# Patient Record
Sex: Female | Born: 1985
Health system: Southern US, Community
[De-identification: ages and names within clinical notes are randomized; demographics above are authoritative.]

## PROBLEM LIST (undated history)

## (undated) DIAGNOSIS — R87619 Unspecified abnormal cytological findings in specimens from cervix uteri: Secondary | ICD-10-CM

## (undated) DIAGNOSIS — IMO0002 Reserved for concepts with insufficient information to code with codable children: Secondary | ICD-10-CM

## (undated) HISTORY — PX: WISDOM TOOTH EXTRACTION: SHX21

## (undated) HISTORY — DX: Unspecified abnormal cytological findings in specimens from cervix uteri: R87.619

## (undated) HISTORY — DX: Reserved for concepts with insufficient information to code with codable children: IMO0002

---

## 2012-09-14 ENCOUNTER — Ambulatory Visit (HOSPITAL_COMMUNITY): Payer: Self-pay

## 2012-09-15 ENCOUNTER — Encounter: Payer: Self-pay | Admitting: Obstetrics & Gynecology

## 2012-09-15 ENCOUNTER — Ambulatory Visit (INDEPENDENT_AMBULATORY_CARE_PROVIDER_SITE_OTHER): Payer: Self-pay | Admitting: Obstetrics & Gynecology

## 2012-09-15 ENCOUNTER — Other Ambulatory Visit (HOSPITAL_COMMUNITY)
Admission: RE | Admit: 2012-09-15 | Discharge: 2012-09-15 | Disposition: A | Discharge status: Home / Self Care | Payer: Self-pay | Source: Ambulatory Visit | Attending: Obstetrics & Gynecology | Admitting: Obstetrics & Gynecology

## 2012-09-15 VITALS — BP 129/85 | HR 64 | Temp 97.0°F | Resp 20 | Ht 64.0 in | Wt 164.7 lb

## 2012-09-15 DIAGNOSIS — Z01812 Encounter for preprocedural laboratory examination: Secondary | ICD-10-CM

## 2012-09-15 DIAGNOSIS — IMO0002 Reserved for concepts with insufficient information to code with codable children: Secondary | ICD-10-CM

## 2012-09-15 DIAGNOSIS — R6889 Other general symptoms and signs: Secondary | ICD-10-CM

## 2012-09-15 DIAGNOSIS — R87619 Unspecified abnormal cytological findings in specimens from cervix uteri: Secondary | ICD-10-CM | POA: Insufficient documentation

## 2012-09-15 LAB — POCT PREGNANCY, URINE: Preg Test, Ur: NEGATIVE

## 2012-09-15 NOTE — Addendum Note (Signed)
Addended by: Toula Moos on: 09/15/2012 02:31 PM   Modules accepted: Orders

## 2012-09-15 NOTE — Progress Notes (Signed)
  Subjective:    Patient ID: Alyssa Nelson, female    DOB: [birth year], 27 y.o.   MRN: [id number]  HPI  Ms. Alyssa Nelson is here for a colposcopy as she had a LGSIL pap 8/13. See note about smoking per RN.  Review of Systems     Objective:   Physical Exam  Colposcopy adequate with aid of endocervical speculum. Aceto white changes on ectocervix. No evidence of any worse dysplasia ECC obtained.      Assessment & Plan:  LGSIL on pap consistent with colposcopy. If ECC is negative, repeat pap in 1 year. Rec no smoking.

## 2012-09-15 NOTE — Progress Notes (Signed)
Pt denied being a current smoker yet had a pack of cigarettes visible in purse. When questioned, pt stated that she "just hasn't had time to throw them away."  Pt now claims to have stopped smoking "1 or 2 weeks ago... or a month."

## 2013-04-17 ENCOUNTER — Emergency Department (HOSPITAL_BASED_OUTPATIENT_CLINIC_OR_DEPARTMENT_OTHER)
Admission: EM | Admit: 2013-04-17 | Discharge: 2013-04-18 | Disposition: A | Discharge status: Home / Self Care | Payer: Self-pay | Attending: Emergency Medicine | Admitting: Emergency Medicine

## 2013-04-17 ENCOUNTER — Encounter (HOSPITAL_BASED_OUTPATIENT_CLINIC_OR_DEPARTMENT_OTHER): Payer: Self-pay | Admitting: Emergency Medicine

## 2013-04-17 DIAGNOSIS — B309 Viral conjunctivitis, unspecified: Secondary | ICD-10-CM | POA: Insufficient documentation

## 2013-04-17 DIAGNOSIS — H5710 Ocular pain, unspecified eye: Secondary | ICD-10-CM | POA: Insufficient documentation

## 2013-04-17 DIAGNOSIS — H53149 Visual discomfort, unspecified: Secondary | ICD-10-CM | POA: Insufficient documentation

## 2013-04-17 DIAGNOSIS — H16143 Punctate keratitis, bilateral: Secondary | ICD-10-CM

## 2013-04-17 DIAGNOSIS — H5789 Other specified disorders of eye and adnexa: Secondary | ICD-10-CM | POA: Insufficient documentation

## 2013-04-17 DIAGNOSIS — H16149 Punctate keratitis, unspecified eye: Secondary | ICD-10-CM | POA: Insufficient documentation

## 2013-04-17 DIAGNOSIS — Z87891 Personal history of nicotine dependence: Secondary | ICD-10-CM | POA: Insufficient documentation

## 2013-04-17 DIAGNOSIS — H579 Unspecified disorder of eye and adnexa: Secondary | ICD-10-CM | POA: Insufficient documentation

## 2013-04-17 DIAGNOSIS — H538 Other visual disturbances: Secondary | ICD-10-CM | POA: Insufficient documentation

## 2013-04-17 NOTE — ED Provider Notes (Signed)
CSN: [id number]     Arrival date & time 04/17/13  2305 History    This chart was scribed for Jones Skene, MD by Quintella Reichert, ED scribe.  This patient was seen in room MH10/MH10 and the patient's care was started at 11:59 PM.     Chief Complaint  Patient presents with  . Eye Problem    The history is provided by the patient. No language interpreter was used.    HPI Comments: Alyssa Nelson is a 27 y.o. female who presents to the Emergency Department complaining of 2 weeks of bilateral burning eye pain with associated redness, itching, drainage, photophobia and blurred vision.   Pt jsd been seen in the ED multiple times since symptoms began and diagnosed with conjunctivitis and has been placed on antibiotic eye drops, without relief.  She was also seen by an ophthalmologist recently and placed on lubricating eye drops, which have also not been effective.  She has attempted to treat pain with ibuprofen, which provides enough relief to allow her to sleep.  Pt denies prior h/o similar symptoms.  She denies environmental allergies to her knowledge.  She denies having bright lights flashed in her eyes and states she does not feel as if there are foreign bodies in her eyes.  She denies fevers, chill, abdominal pain, vaginal discharge, nausea, vomiting, rhinorrhea, cough, congestion, sore throat, CP, SOB, myalgias, arthralgias, or dysuria.    Past Medical History  Diagnosis Date  . Abnormal Pap smear     Past Surgical History  Procedure Laterality Date  . Wisdom tooth extraction      age 21    No family history on file.   History  Substance Use Topics  . Smoking status: Former Games developer  . Smokeless tobacco: Not on file  . Alcohol Use: Yes     Comment: socially    OB History   Grav Para Term Preterm Abortions TAB SAB Ect Mult Living   0                Review of Systems At least 10pt or greater review of systems completed and are negative except where specified in the  HPI.  Allergies  Review of patient's allergies indicates no known allergies.  Home Medications  No current outpatient prescriptions on file.  BP 128/79  Pulse 82  Temp(Src) 98.8 F (37.1 C) (Oral)  Resp 16  Ht 5\' 4"  (1.626 m)  Wt 160 lb (72.576 kg)  BMI 27.45 kg/m2  SpO2 97%  LMP 03/22/2013  Physical Exam  Nursing notes reviewed.  Electronic medical record reviewed. VITAL SIGNS:   Filed Vitals:   04/17/13 2327  BP: 128/79  Pulse: 82  Temp: 98.8 F (37.1 C)  TempSrc: Oral  Resp: 16  Height: 5\' 4"  (1.626 m)  Weight: 160 lb (72.576 kg)  SpO2: 97%   CONSTITUTIONAL: Awake, oriented, appears non-toxic HENT: Atraumatic, normocephalic, oral mucosa pink and moist, airway patent. Nares patent without drainage. External ears normal. EYES: Conjunctiva clear, EOMI, PERRLA, punctate fluorescein uptake bilaterally. No hyphema, no hypopyon. No uveitis, no photophobia, anterior chambers clear. NECK: Trachea midline, non-tender, supple CARDIOVASCULAR: Normal heart rate, Normal rhythm, No murmurs, rubs, gallops PULMONARY/CHEST: Clear to auscultation, no rhonchi, wheezes, or rales. Symmetrical breath sounds. Non-tender. ABDOMINAL: Non-distended, soft, non-tender - no rebound or guarding.  BS normal. NEUROLOGIC: Non-focal, moving all four extremities, no gross sensory or motor deficits. EXTREMITIES: No clubbing, cyanosis, or edema SKIN: Warm, Dry, No erythema, No rash  ED Course  Procedures (including critical care time)  DIAGNOSTIC STUDIES: Oxygen Saturation is 97% on room air, normal by my interpretation.    COORDINATION OF CARE: 11:59 PM-Discussed treatment plan which includes add Claritin to her medication regimen and to increase usage of lubricating eyedrops with pt at bedside and pt agreed to plan.   Labs Reviewed - No data to display No results found. 1. Viral conjunctivitis   2. Punctate keratitis of both eyes    Medications  proparacaine (ALCAINE) 0.5 % ophthalmic  solution 2 drop (2 drops Both Eyes Given by Other 04/18/13 0020)  fluorescein ophthalmic strip 2 strip (2 strips Both Eyes Given by Other 04/18/13 0021)  loratadine (CLARITIN) tablet 10 mg (10 mg Oral Given 04/18/13 0015)    MDM  Patient likely viral conjunctivitis however there may be an allergic component to this patient's problems. She is are seen ophthalmologist recommended moisturizing eye drops, on my exam, punctate keratitis suggestive of chronic dry eye, again this could potentially be exacerbated by viral or allergic symptoms. Patient is appropriate followup, can see the ophthalmologist again, we'll increase frequency of lubricating eyedrops and Claritin.  I explained the diagnosis and have given explicit precautions to return to the ER including changes in vision, double vision or any other new or worsening symptoms. The patient understands and accepts the medical plan as it's been dictated and I have answered their questions. Discharge instructions concerning home care and prescriptions have been given.  The patient is STABLE and is discharged to home in good condition.     I personally performed the services described in this documentation, which was scribed in my presence. The recorded information has been reviewed and is accurate. Jones Skene, M.D.      Jones Skene, MD 04/18/13 [phone]

## 2013-04-17 NOTE — ED Notes (Signed)
Eyes Red, painful, swollen, draining and itching x2 weeks.  Sts she was seen by [facility] ED x2 and given drops both times.  Sts she saw an "eye specialist" and was given a lubricant eye drop. Eyes are no better.

## 2013-04-18 MED ORDER — FLUORESCEIN SODIUM 1 MG OP STRP
2.0000 | ORAL_STRIP | Freq: Once | OPHTHALMIC | Status: AC
  Administered 2013-04-18: 2 via OPHTHALMIC
  Filled 2013-04-18: qty 1

## 2013-04-18 MED ORDER — PROPARACAINE HCL 0.5 % OP SOLN
2.0000 [drp] | Freq: Once | OPHTHALMIC | Status: AC
  Administered 2013-04-18: 2 [drp] via OPHTHALMIC
  Filled 2013-04-18: qty 15

## 2013-04-18 MED ORDER — LORATADINE 10 MG PO TABS
10.0000 mg | ORAL_TABLET | Freq: Once | ORAL | Status: AC
  Administered 2013-04-18: 10 mg via ORAL
  Filled 2013-04-18: qty 1

## 2013-04-18 MED ORDER — NAPHAZOLINE-PHENIRAMINE 0.025-0.3 % OP SOLN
1.0000 [drp] | Freq: Once | OPHTHALMIC | Status: DC
  Filled 2013-04-18: qty 15

## 2013-04-18 MED ORDER — LORATADINE 10 MG PO TABS: 10.0000 mg | ORAL_TABLET | Freq: Every day | ORAL | Status: DC

## 2013-04-18 NOTE — ED Notes (Signed)
MD at bedside for eye exam

## 2013-04-18 NOTE — ED Notes (Signed)
MD back at bedside to complete eye exam 

## 2013-10-01 ENCOUNTER — Emergency Department (HOSPITAL_BASED_OUTPATIENT_CLINIC_OR_DEPARTMENT_OTHER)
Admission: EM | Admit: 2013-10-01 | Discharge: 2013-10-01 | Disposition: A | Discharge status: Home / Self Care | Payer: Self-pay | Attending: Emergency Medicine | Admitting: Emergency Medicine

## 2013-10-01 ENCOUNTER — Emergency Department (HOSPITAL_BASED_OUTPATIENT_CLINIC_OR_DEPARTMENT_OTHER): Payer: Self-pay

## 2013-10-01 ENCOUNTER — Encounter (HOSPITAL_BASED_OUTPATIENT_CLINIC_OR_DEPARTMENT_OTHER): Payer: Self-pay | Admitting: Emergency Medicine

## 2013-10-01 DIAGNOSIS — Z79899 Other long term (current) drug therapy: Secondary | ICD-10-CM | POA: Insufficient documentation

## 2013-10-01 DIAGNOSIS — Z3202 Encounter for pregnancy test, result negative: Secondary | ICD-10-CM | POA: Insufficient documentation

## 2013-10-01 DIAGNOSIS — R51 Headache: Secondary | ICD-10-CM | POA: Insufficient documentation

## 2013-10-01 DIAGNOSIS — Z87891 Personal history of nicotine dependence: Secondary | ICD-10-CM | POA: Insufficient documentation

## 2013-10-01 DIAGNOSIS — Z791 Long term (current) use of non-steroidal anti-inflammatories (NSAID): Secondary | ICD-10-CM | POA: Insufficient documentation

## 2013-10-01 DIAGNOSIS — R1013 Epigastric pain: Secondary | ICD-10-CM | POA: Insufficient documentation

## 2013-10-01 DIAGNOSIS — M549 Dorsalgia, unspecified: Secondary | ICD-10-CM | POA: Insufficient documentation

## 2013-10-01 DIAGNOSIS — R079 Chest pain, unspecified: Secondary | ICD-10-CM | POA: Insufficient documentation

## 2013-10-01 DIAGNOSIS — R109 Unspecified abdominal pain: Secondary | ICD-10-CM

## 2013-10-01 DIAGNOSIS — R6883 Chills (without fever): Secondary | ICD-10-CM | POA: Insufficient documentation

## 2013-10-01 LAB — URINALYSIS, ROUTINE W REFLEX MICROSCOPIC
Bilirubin Urine: NEGATIVE
Glucose, UA: NEGATIVE mg/dL
Hgb urine dipstick: NEGATIVE
KETONES UR: NEGATIVE mg/dL
Nitrite: NEGATIVE
Protein, ur: NEGATIVE mg/dL
SPECIFIC GRAVITY, URINE: 1.016 (ref 1.005–1.030)
Urobilinogen, UA: 0.2 mg/dL (ref 0.0–1.0)
pH: 6.5 (ref 5.0–8.0)

## 2013-10-01 LAB — CBC WITH DIFFERENTIAL/PLATELET
BASOS ABS: 0 10*3/uL (ref 0.0–0.1)
Basophils Relative: 0 % (ref 0–1)
Eosinophils Absolute: 0.2 10*3/uL (ref 0.0–0.7)
Eosinophils Relative: 2 % (ref 0–5)
HCT: 41.7 % (ref 36.0–46.0)
Hemoglobin: 13.9 g/dL (ref 12.0–15.0)
LYMPHS PCT: 38 % (ref 12–46)
Lymphs Abs: 2.7 10*3/uL (ref 0.7–4.0)
MCH: 30.9 pg (ref 26.0–34.0)
MCHC: 33.3 g/dL (ref 30.0–36.0)
MCV: 92.7 fL (ref 78.0–100.0)
Monocytes Absolute: 0.6 10*3/uL (ref 0.1–1.0)
Monocytes Relative: 9 % (ref 3–12)
NEUTROS ABS: 3.6 10*3/uL (ref 1.7–7.7)
Neutrophils Relative %: 51 % (ref 43–77)
PLATELETS: 298 10*3/uL (ref 150–400)
RBC: 4.5 MIL/uL (ref 3.87–5.11)
RDW: 13.1 % (ref 11.5–15.5)
WBC: 7.1 10*3/uL (ref 4.0–10.5)

## 2013-10-01 LAB — COMPREHENSIVE METABOLIC PANEL
ALBUMIN: 3.8 g/dL (ref 3.5–5.2)
ALT: 12 U/L (ref 0–35)
AST: 19 U/L (ref 0–37)
Alkaline Phosphatase: 75 U/L (ref 39–117)
BUN: 9 mg/dL (ref 6–23)
CHLORIDE: 108 meq/L (ref 96–112)
CO2: 21 meq/L (ref 19–32)
CREATININE: 0.8 mg/dL (ref 0.50–1.10)
Calcium: 9.1 mg/dL (ref 8.4–10.5)
GFR calc Af Amer: >90 mL/min (ref >90)
Glucose, Bld: 90 mg/dL (ref 70–99)
Potassium: 4.5 mEq/L (ref 3.7–5.3)
Sodium: 144 mEq/L (ref 137–147)
Total Protein: 7.3 g/dL (ref 6.0–8.3)

## 2013-10-01 LAB — LIPASE, BLOOD: Lipase: 32 U/L (ref 11–59)

## 2013-10-01 LAB — PREGNANCY, URINE: Preg Test, Ur: NEGATIVE

## 2013-10-01 LAB — URINE MICROSCOPIC-ADD ON

## 2013-10-01 MED ORDER — FAMOTIDINE 20 MG PO TABS: 20.0000 mg | ORAL_TABLET | Freq: Two times a day (BID) | ORAL | Status: DC

## 2013-10-01 MED ORDER — NAPROXEN 500 MG PO TABS: 500.0000 mg | ORAL_TABLET | Freq: Two times a day (BID) | ORAL | Status: DC

## 2013-10-01 NOTE — Discharge Instructions (Signed)
°Emergency Department Resource Guide °1) Find a Doctor and Pay Out of Pocket °Although you won't have to find out who is covered by your insurance plan, it is a good idea to ask around and get recommendations. You will then need to call the office and see if the doctor you have chosen will accept you as a new patient and what types of options they offer for patients who are self-pay. Some doctors offer discounts or will set up payment plans for their patients who do not have insurance, but you will need to ask so you aren't surprised when you get to your appointment. ° °2) Contact Your Local Health Department °Not all health departments have doctors that can see patients for sick visits, but many do, so it is worth a call to see if yours does. If you don't know where your local health department is, you can check in your phone book. The CDC also has a tool to help you locate your state's health department, and many state websites also have listings of all of their local health departments. ° °3) Find a Walk-in Clinic °If your illness is not likely to be very severe or complicated, you may want to try a walk in clinic. These are popping up all over the country in pharmacies, drugstores, and shopping centers. They're usually staffed by nurse practitioners or physician assistants that have been trained to treat common illnesses and complaints. They're usually fairly quick and inexpensive. However, if you have serious medical issues or chronic medical problems, these are probably not your best option. ° °No Primary Care Doctor: °- Call Health Connect at  832-8000 - they can help you locate a primary care doctor that  accepts your insurance, provides certain services, etc. °- Physician Referral Service- 1-800-533-3463 ° °Chronic Pain Problems: °Organization         Address  Phone   Notes  °[facility]  (336) 297-2271 Patients need to be referred by their primary care doctor.  ° °Medication  Assistance: °Organization         Address  Phone   Notes  °[place] [facility] [address] °[place], [place] [address] (336) 641-8030 --Must be a resident of [place] °-- Must have NO insurance coverage whatsoever (no Medicaid/ Medicare, etc.) °-- The pt. MUST have a primary care doctor that directs their care regularly and follows them in the community °  °[facility]  (866) 331-1348   °[employer]  (888) 892-1162   ° °Agencies that provide inexpensive medical care: °Organization         Address  Phone   Notes  °[facility]  (336) 832-8035   °[facility]    (336) 832-7272   °[facility] [address] °[place], [place] [address] (336) 832-4777   °[facility] [address], °[place] (336) 271-4999   °[facility]    (336) 373-0678   °[facility]    (336) 272-1050   °[facility] ° [address], [place] Phone:  (336) 832-4444, Fax:  (336) 832-4440 Hours of Operation:  9 am - 6 pm, M-F.  Also accepts Medicaid/Medicare and self-pay.  °[facility] ° [address], [place] Phone: (336) 832-3150, Fax: (336) [phone]. Hours of Operation:  8:30 am - 5:30 pm, M-F.  Also accepts Medicaid and self-pay.  °[facility] [place] [address], [place] Phone: (336) 878-6027   °[facility] [address], [place], [place] (336)723-1848, Ext. 123 Mondays & Thursdays: 7-9 AM.  First 15 patients are seen on a first come, first serve basis. °  ° °Medicaid-accepting [place] Providers: ° °Organization         Address  Phone   Notes  °[facility] [address], [place] (336) 641-2100 Also accepts self-pay patients.  °[facility] [address], [place] ° (336) 856-9996   °[facility] [address], [place]  (336) 288-8857   °[facility] [address] [place] [address], [place] (336) 299-7000   °Veita Bland [address], [place]  ° (336) 373-1557 Only accepts [place] Access Medicaid patients after they have their name applied to their card.  ° °Self-Pay (no insurance) in [place]: ° °Organization         Address  Phone   Notes  °Sickle Cell Patients, [facility] [address], [place] (336) 832-1970   °[facility] [address], [place] (336) 832-4400   °[facility] ° [address], [place] (336) 992-4800   °[facility]/Dr. Osei-Bonsu ° [address] [place] [address], [place] or [address], [place] (336) [phone] Phone number for both [place] and [place] locations is the same.  °[facility] [address], [place] (336) 299-0000   °[facility] [address] [place] [address], [place] or [address] (336) 852-7530 °(336) 878-2260   °[facility] [address], [place] (336) 350-1642, phone; (336) 294-5005, fax Sees patients 1st and 3rd Saturday of every month.  Must not qualify for public or private insurance (i.e. Medicaid, Medicare, [place] Health Choice, Veterans' Benefits) • Household income should be no more than 200% of the poverty level •The clinic cannot treat you if you are pregnant or think you are pregnant • Sexually transmitted diseases are not treated at the clinic.  ° ° °Dental Care: °Organization         Address  Phone  Notes  °[place] [facility] [address], [place] (336) 641-6152 Accepts children up to age 21 who are enrolled in Medicaid or [place] Health Choice; pregnant women with a Medicaid card; and children who have applied for Medicaid or [place] Health Choice, but were declined, whose parents can pay a reduced fee at time of service.  °[place]  [facility] [place]  [address], [place] (336) 641-7733 Accepts children up to age 21 who are enrolled in Medicaid or [place] Health Choice; pregnant women with a Medicaid card; and children who have applied for Medicaid or [place] Health Choice, but were declined, whose parents can pay a reduced fee at time of service.  °[facility] ° [address], [place] (336) 641-4533 Patients are seen by appointment only. Walk-ins are not accepted. [facility] will see patients 18 years of age and older. °Monday - Tuesday (8am-5pm) °Most Wednesdays (8:30-5pm) °$30 per visit, cash only  °[facility] ° [address], [place] (336) 641-4533 Patients are seen by appointment only. Walk-ins are not accepted. [facility] will see patients 18 years of age and older. °One   Wednesday Evening (Monthly: Volunteer Based).  $30 per visit, cash only  °[facility]  (919) 537-3737 for adults; Children under age 4, call Graduate Pediatric Dentistry at (919) [phone]. Children aged 4-14, please call (919) [phone] to request a pediatric application. ° Dental services are provided in all areas of dental care including fillings, crowns and bridges, complete and partial dentures, implants, gum treatment, root canals, and extractions. Preventive care is also provided. Treatment is provided to both adults and children. °Patients are selected via a lottery and there is often a waiting list. °  °[facility] [address], °[place] ° (336) 763-8833 [email or web address] °  °[facility] [address], [place], [place] (336)723-1848, Ext. 123 Second and Fourth Thursday of each month, opens at 6:30 AM; Clinic ends at 9 AM.  Patients are seen on a first-come first-served basis, and a limited number are seen during each clinic.  ° °[facility] ° [address], [place], [place] (336) 723-7904    Eligibility Requirements °You must have lived in [place], [place], or [place] counties for at least the last three months. °  You cannot be eligible for state or federal sponsored healthcare insurance, including Veterans Administration, Medicaid, or Medicare. °  You generally cannot be eligible for healthcare insurance through your employer.  °  How to apply: °Eligibility screenings are held every Tuesday and Wednesday afternoon from 1:00 pm until 4:00 pm. You do not need an appointment for the interview!  °[facility] [address], [place], [place] 336-631-2330   °[facility]  336-342-8273   °[facility]  336-703-3100   °[facility]  336-570-6415   ° °Behavioral Health Resources in the Community: °Intensive Outpatient Programs °Organization         Address  Phone  Notes  °[facility] [address], [place], [place] 336-878-6098   °[facility] [address], [place], [place] 336-832-9800   °[facility] [address], [place], [place] ° 336-882-2125   °[facility] [address],  °[place], [place] 1-800-853-5163 or 336-641-4981   °Substance Abuse Resources °Organization         Address  Phone  Notes  °[facility]  336-882-2125   °[facility]  336-784-9470   °[facility]  336-285-9073   °[facility]  336-845-3988   °Residential & Outpatient Substance Abuse Program  1-800-659-3381   °Psychological Services °Organization         Address  Phone  Notes  °[facility]  336- 832-9600   °[facility]  336- 378-7881   °[facility] [address], [place] 1-800-853-5163 or 336-641-4981   ° °Mobile Crisis Teams °Organization         Address  Phone  Notes  °[facility], Mobile Crisis Care Unit  1-877-626-1772   °Assertive °[facility] ° [address]  [place], [place] 336-834-9664   °Sharon DeEsch [address] °[place] 336-554-5454   ° °Self-Help/Support Groups °Organization         Address  Phone             Notes  °[facility] of  - variety of support groups  336- 373-1402 Call for more information  °Narcotics Anonymous (NA), [facility] [address], °[place]  2 meetings at this location  ° °  Residential Treatment Programs Organization         Address  Phone  Notes  [facility] [address],    [place]  [phone]   [facility]  [address], [place] [phone], [place], [place] [phone]   [facility] [address], [place] [phone] Admissions: 8am-3pm M-F  [facility] [address]    [place], [place] [phone]   [facility] [address], [place], [place] [phone]   [facility] [address]  [place], [place] [phone]   [facility] Intensive Outpatient [address], [place], [place] [phone]) [phone]   [facility] [address]  [place], [place] [phone] or [phone]   [facility] [address] [place], [place] [phone] Accepts Medicaid  [facility] [address]  [place] [phone] Substance Abuse/Addiction Treatment   [facility] Organization         Address  Phone  Notes  [facility]  774-711-7637(888) [phone]   Angie FavaJulie Brannon, PhD [address] [place], [place]   [phone](336) [phone] or [phone](336) [phone]   [facility]   [address] [place], [place] [phone](336) ([phone])[phone]   [facility] [address], [place], [place] [phone](336) [phone] Insurance/Medicaid/sponsorship through [facility] [address]                                    [place], [place] [phone](336) [phone] Therapy/tele-psych/case    [facility] [address]   [place], [place] [phone](336) [phone]    Dr. Lolly MustacheArfeen  ([phone](336) ([phone]   [facility] [place] [facility]  [employer] [facility] 1) [address], [place] 2) [address], [place] 3)  [address], [place] [phone](336) [phone](336) [phone]  [phone](336) [phone]   [facility] Child Abuse Hotline (832) 473-8120(336) (801)316-9892 or [phone](336) 820-080-3066 (After Hours)      Resource guide provided to help you find a primary care Dr. for further evaluation of the long-standing chest pain. Today's workup was negative. No evidence of any acute cardiac problem or lung problem. In addition, labs were all normal as well. Urinalysis was normal. Return for any new or worse symptoms. Take medications provided as directed.

## 2013-10-01 NOTE — ED Notes (Signed)
Reports reoccurrence of vomiting, blood in vomit, and chest pain this morning.  Has been dealing with this since 2013.

## 2013-10-01 NOTE — ED Provider Notes (Signed)
CSN: [id number]     Arrival date & time 10/01/13  1439 History  This chart was scribed for Alyssa Jakes, MD by Ronal Fear, ED Scribe. This patient was seen in room MH05/MH05 and the patient's care was started at 3:44 PM.    Chief Complaint  Patient presents with  . Emesis   (Consider location/radiation/quality/duration/timing/severity/associated sxs/prior Treatment) Patient is a 28 y.o. female presenting with vomiting. The history is provided by the patient. No language interpreter was used.  Emesis Associated symptoms: abdominal pain, chills and headaches   Associated symptoms: no diarrhea and no sore throat    HPI Comments: Alyssa Nelson is a 28 y.o. female who presents to the Emergency Department complaining of 2 episodes of vomiting and chest pain this morning with some blood in the vomit. She also complains of sharp epigastric pain that radiates to her back since she began her depo 10 years ago.  Pt states that this is a recurrent problem that she has been dealing with since 2013. Pt states that today she just got tired of the symptoms and wants to find out why she is having the generalized stabbing sharp chest pain.  The pain is intermittent and occurs for less than 5 minutes, it subsides for 2 minutes and then reoccurs.  Pt has taking oxycodone and Vicodin for the CP with relief.    Past Medical History  Diagnosis Date  . Abnormal Pap smear    Past Surgical History  Procedure Laterality Date  . Wisdom tooth extraction      age 80   No family history on file. History  Substance Use Topics  . Smoking status: Former Games developer  . Smokeless tobacco: Not on file  . Alcohol Use: 3.6 oz/week    6 Cans of beer per week     Comment: socially   OB History   Grav Para Term Preterm Abortions TAB SAB Ect Mult Living   0              Review of Systems  Constitutional: Positive for chills. Negative for fever.  HENT: Negative for rhinorrhea and sore throat.   Eyes: Negative for  visual disturbance.  Respiratory: Negative for cough and shortness of breath.   Cardiovascular: Positive for chest pain. Negative for leg swelling.  Gastrointestinal: Positive for vomiting and abdominal pain. Negative for diarrhea.  Genitourinary: Negative for dysuria.  Musculoskeletal: Positive for back pain.  Skin: Negative for rash.  Neurological: Positive for headaches.  Hematological: Does not bruise/bleed easily.  Psychiatric/Behavioral: Negative for confusion.  All other systems reviewed and are negative.    Allergies  Review of patient's allergies indicates no known allergies.  Home Medications   Current Outpatient Rx  Name  Route  Sig  Dispense  Refill  . medroxyPROGESTERone (DEPO-PROVERA) 150 MG/ML injection   Intramuscular   Inject 150 mg into the muscle every 3 (three) months.         . famotidine (PEPCID) 20 MG tablet   Oral   Take 1 tablet (20 mg total) by mouth 2 (two) times daily.   30 tablet   0   . loratadine (CLARITIN) 10 MG tablet   Oral   Take 1 tablet (10 mg total) by mouth daily.   30 tablet   0   . naproxen (NAPROSYN) 500 MG tablet   Oral   Take 1 tablet (500 mg total) by mouth 2 (two) times daily.   14 tablet   0  BP 118/67  Temp(Src) 98.7 F (37.1 C) (Oral)  Resp 16  Ht 5\' 4"  (1.626 m)  Wt 160 lb (72.576 kg)  BMI 27.45 kg/m2  SpO2 100% Physical Exam  Nursing note and vitals reviewed. Constitutional: She is oriented to person, place, and time. She appears well-developed and well-nourished. No distress.  HENT:  Head: Normocephalic and atraumatic.  Mouth/Throat: Oropharynx is clear and moist. No oropharyngeal exudate.  Eyes: EOM are normal.  Neck: Neck supple. No tracheal deviation present.  Cardiovascular: Normal rate and regular rhythm.   Pulmonary/Chest: Effort normal and breath sounds normal. No respiratory distress. She has no wheezes. She has no rales.  Abdominal: Soft. Bowel sounds are normal. There is no tenderness.   Musculoskeletal: Normal range of motion. She exhibits no edema.  Neurological: She is alert and oriented to person, place, and time. No cranial nerve deficit. She exhibits normal muscle tone. Coordination normal.  Skin: Skin is warm and dry.  Psychiatric: She has a normal mood and affect. Her behavior is normal.    ED Course  Procedures (including critical care time)  DIAGNOSTIC STUDIES: Oxygen Saturation is 100% on RA, normal by my interpretation.    COORDINATION OF CARE: 3:59 PM- Pt advised of plan for treatment and pt agrees.    Labs Review Labs Reviewed  URINALYSIS, ROUTINE W REFLEX MICROSCOPIC - Abnormal; Notable for the following:    Leukocytes, UA SMALL (*)    All other components within normal limits  COMPREHENSIVE METABOLIC PANEL - Abnormal; Notable for the following:    Total Bilirubin <0.2 (*)    All other components within normal limits  PREGNANCY, URINE  URINE MICROSCOPIC-ADD ON  LIPASE, BLOOD  CBC WITH DIFFERENTIAL   Results for orders placed during the hospital encounter of 10/01/13  URINALYSIS, ROUTINE W REFLEX MICROSCOPIC      Result Value Range   Color, Urine YELLOW  YELLOW   APPearance CLEAR  CLEAR   Specific Gravity, Urine 1.016  1.005 - 1.030   pH 6.5  5.0 - 8.0   Glucose, UA NEGATIVE  NEGATIVE mg/dL   Hgb urine dipstick NEGATIVE  NEGATIVE   Bilirubin Urine NEGATIVE  NEGATIVE   Ketones, ur NEGATIVE  NEGATIVE mg/dL   Protein, ur NEGATIVE  NEGATIVE mg/dL   Urobilinogen, UA 0.2  0.0 - 1.0 mg/dL   Nitrite NEGATIVE  NEGATIVE   Leukocytes, UA SMALL (*) NEGATIVE  PREGNANCY, URINE      Result Value Range   Preg Test, Ur NEGATIVE  NEGATIVE  URINE MICROSCOPIC-ADD ON      Result Value Range   Squamous Epithelial / LPF RARE  RARE   WBC, UA 3-6  <3 WBC/hpf   Bacteria, UA RARE  RARE  COMPREHENSIVE METABOLIC PANEL      Result Value Range   Sodium 144  137 - 147 mEq/L   Potassium 4.5  3.7 - 5.3 mEq/L   Chloride 108  96 - 112 mEq/L   CO2 21  19 - 32  mEq/L   Glucose, Bld 90  70 - 99 mg/dL   BUN 9  6 - 23 mg/dL   Creatinine, Ser 9.56  0.50 - 1.10 mg/dL   Calcium 9.1  8.4 - 21.3 mg/dL   Total Protein 7.3  6.0 - 8.3 g/dL   Albumin 3.8  3.5 - 5.2 g/dL   AST 19  0 - 37 U/L   ALT 12  0 - 35 U/L   Alkaline Phosphatase 75  39 - 117  U/L   Total Bilirubin <0.2 (*) 0.3 - 1.2 mg/dL   GFR calc non Af Amer >90  >90 mL/min   GFR calc Af Amer >90  >90 mL/min  LIPASE, BLOOD      Result Value Range   Lipase 32  11 - 59 U/L  CBC WITH DIFFERENTIAL      Result Value Range   WBC 7.1  4.0 - 10.5 K/uL   RBC 4.50  3.87 - 5.11 MIL/uL   Hemoglobin 13.9  12.0 - 15.0 g/dL   HCT 16.141.7  09.636.0 - 04.546.0 %   MCV 92.7  78.0 - 100.0 fL   MCH 30.9  26.0 - 34.0 pg   MCHC 33.3  30.0 - 36.0 g/dL   RDW 40.913.1  81.111.5 - 91.415.5 %   Platelets 298  150 - 400 K/uL   Neutrophils Relative % 51  43 - 77 %   Neutro Abs 3.6  1.7 - 7.7 K/uL   Lymphocytes Relative 38  12 - 46 %   Lymphs Abs 2.7  0.7 - 4.0 K/uL   Monocytes Relative 9  3 - 12 %   Monocytes Absolute 0.6  0.1 - 1.0 K/uL   Eosinophils Relative 2  0 - 5 %   Eosinophils Absolute 0.2  0.0 - 0.7 K/uL   Basophils Relative 0  0 - 1 %   Basophils Absolute 0.0  0.0 - 0.1 K/uL    Imaging Review Dg Chest 2 View  10/01/2013   CLINICAL DATA:  Vomiting  EXAM: CHEST  2 VIEW  COMPARISON:  DG THORACIC SPINE 4+V dated 09/22/2010  FINDINGS: The heart size and mediastinal contours are within normal limits. Both lungs are clear. The visualized skeletal structures are unremarkable. Minimal biapical pleural thickening is incidentally noted.  IMPRESSION: No active cardiopulmonary disease.   Electronically Signed   By: Christiana PellantGretchen  Green M.D.   On: 10/01/2013 17:15    EKG Interpretation    Date/Time:  Saturday October 01 2013 16:46:44 EST Ventricular Rate:  64 PR Interval:  128 QRS Duration: 80 QT Interval:  396 QTC Calculation: 408 R Axis:   51 Text Interpretation:  Normal sinus rhythm Normal ECG No previous ECGs available Confirmed by  Georgina Krist  MD, Canary Fister ([id number]) on 10/01/2013 4:54:04 PM            MDM   1. Chest pain   2. Abdominal pain    Workup for chest pain without any significant findings. EKG without any acute changes. Troponin was negative. Chest x-ray negative for pneumonia pneumothorax or pulmonary edema. Labs without leukocytosis or anemia urinalysis negative for urinary tract infection no pregnancy. Patient's electrolytes including liver function test are negative. The liver function tests as well as lipase which does not show pancreatitis was done for the complaining of the epigastric abdominal pain. Patient states that she has been seen several times for this in the past without any specific findings. Have given her a resource guide for additional followup. We'll treat her with Pepcid and Naprosyn.  I personally performed the services described in this documentation, which was scribed in my presence. The recorded information has been reviewed and is accurate.     Alyssa JakesScott W. Theodoro Koval, MD 10/01/13 [id number]

## 2013-10-01 NOTE — ED Notes (Signed)
Pt states she has generalized pain which she has been taking oxycodone from her friends.  Pt states she has been vomiting and noticing a few drops of blood.  Pt admits she drinks frequently, approximately a 6 pack per week.

## 2015-03-20 ENCOUNTER — Emergency Department (HOSPITAL_BASED_OUTPATIENT_CLINIC_OR_DEPARTMENT_OTHER)
Admission: EM | Admit: 2015-03-20 | Discharge: 2015-03-20 | Disposition: A | Discharge status: Home / Self Care | Payer: Self-pay | Attending: Emergency Medicine | Admitting: Emergency Medicine

## 2015-03-20 ENCOUNTER — Encounter (HOSPITAL_BASED_OUTPATIENT_CLINIC_OR_DEPARTMENT_OTHER): Payer: Self-pay

## 2015-03-20 DIAGNOSIS — N390 Urinary tract infection, site not specified: Secondary | ICD-10-CM | POA: Insufficient documentation

## 2015-03-20 DIAGNOSIS — Z3202 Encounter for pregnancy test, result negative: Secondary | ICD-10-CM | POA: Insufficient documentation

## 2015-03-20 DIAGNOSIS — Z720 Tobacco use: Secondary | ICD-10-CM | POA: Insufficient documentation

## 2015-03-20 LAB — URINALYSIS, ROUTINE W REFLEX MICROSCOPIC
BILIRUBIN URINE: NEGATIVE
Glucose, UA: NEGATIVE mg/dL
Ketones, ur: NEGATIVE mg/dL
Nitrite: NEGATIVE
Protein, ur: NEGATIVE mg/dL
SPECIFIC GRAVITY, URINE: 1.028 (ref 1.005–1.030)
Urobilinogen, UA: 0.2 mg/dL (ref 0.0–1.0)
pH: 6 (ref 5.0–8.0)

## 2015-03-20 LAB — URINE MICROSCOPIC-ADD ON

## 2015-03-20 LAB — PREGNANCY, URINE: Preg Test, Ur: NEGATIVE

## 2015-03-20 MED ORDER — CEPHALEXIN 500 MG PO CAPS: 500.0000 mg | ORAL_CAPSULE | Freq: Four times a day (QID) | ORAL | Status: DC

## 2015-03-20 NOTE — Discharge Instructions (Signed)
You were evaluated in the ED for your abdominal discomfort. Your symptoms may be due to the UTI. Please take your antibiotic as directed. Take all of your antibiotic as prescribed, do not save them. Follow-up with primary care, return to ED for new worsening symptoms.  Urinary Tract Infection A urinary tract infection (UTI) can occur any place along the urinary tract. The tract includes the kidneys, ureters, bladder, and urethra. A type of germ called bacteria often causes a UTI. UTIs are often helped with antibiotic medicine.  HOME CARE   If given, take antibiotics as told by your doctor. Finish them even if you start to feel better.  Drink enough fluids to keep your pee (urine) clear or pale yellow.  Avoid tea, drinks with caffeine, and bubbly (carbonated) drinks.  Pee often. Avoid holding your pee in for a long time.  Pee before and after having sex (intercourse).  Wipe from front to back after you poop (bowel movement) if you are a woman. Use each tissue only once. GET HELP RIGHT AWAY IF:   You have back pain.  You have lower belly (abdominal) pain.  You have chills.  You feel sick to your stomach (nauseous).  You throw up (vomit).  Your burning or discomfort with peeing does not go away.  You have a fever.  Your symptoms are not better in 3 days. MAKE SURE YOU:   Understand these instructions.  Will watch your condition.  Will get help right away if you are not doing well or get worse. Document Released: 02/11/2008 Document Revised: 05/19/2012 Document Reviewed: 03/25/2012 [facility] Patient Information 2015 [employer], [place]. This information is not intended to replace advice given to you by your health care provider. Make sure you discuss any questions you have with your health care provider.

## 2015-03-20 NOTE — ED Notes (Signed)
Patient called to state that she could not have her prescriptions filled here today and could not get back before 6 pm to pick them up.  [facility] [address] [place] [phone].  Called and left on the MD call line.

## 2015-03-20 NOTE — ED Notes (Addendum)
C/o abd pain "for months" when asked what was different today, pt states "my friend was coming so i decided to come and be seen"-pt states she has been having vaginal bleeding-depo injection fr BCP-last was May

## 2015-03-20 NOTE — ED Provider Notes (Signed)
CSN: [id number]     Arrival date & time 03/20/15  1504 History   First MD Initiated Contact with Patient 03/20/15 1529     Chief Complaint  Patient presents with  . Abdominal Pain     (Consider location/radiation/quality/duration/timing/severity/associated sxs/prior Treatment) HPI Alyssa Nelson is a 29 y.o. female who comes in for evaluation of abdominal discomfort. Patient states she has had abdominal discomfort since July 2014. She has not seen anybody for this problem. She reports the discomfort is worse when she drinks cold liquids. She reports coming to the ED today "because my friend was coming and I just wanted to come get checked out". She denies any discomfort now in the ED. States "I feel fine". She reports having recently started the Depo shot and has had intermittent vaginal bleeding. She denies any fevers, chills, nausea or vomiting, urinary symptoms, back pain, pelvic pain, unusual vaginal discharge. No other modifying factors.  Past Medical History  Diagnosis Date  . Abnormal Pap smear    Past Surgical History  Procedure Laterality Date  . Wisdom tooth extraction      age 52   No family history on file. History  Substance Use Topics  . Smoking status: Current Every Day Smoker  . Smokeless tobacco: Not on file  . Alcohol Use: Yes   OB History    Gravida Para Term Preterm AB TAB SAB Ectopic Multiple Living   0              Review of Systems A 10 point review of systems was completed and was negative except for pertinent positives and negatives as mentioned in the history of present illness     Allergies  Review of patient's allergies indicates no known allergies.  Home Medications   Prior to Admission medications   Medication Sig Start Date End Date Taking? Authorizing Provider  cephALEXin (KEFLEX) 500 MG capsule Take 1 capsule (500 mg total) by mouth 4 (four) times daily. 03/20/15   Joycie Peek, PA-C  medroxyPROGESTERone (DEPO-PROVERA) 150 MG/ML  injection Inject 150 mg into the muscle every 3 (three) months.    Historical Provider, MD   BP 146/90 mmHg  Pulse 74  Temp(Src) 98.5 F (36.9 C) (Oral)  Resp 20  Ht  (1.626 m)  Wt 175 lb (79.379 kg)  BMI 30.02 kg/m2  SpO2 100% Physical Exam  Constitutional: She is oriented to person, place, and time. She appears well-developed and well-nourished.  HENT:  Head: Normocephalic and atraumatic.  Mouth/Throat: Oropharynx is clear and moist.  Eyes: Conjunctivae are normal. Pupils are equal, round, and reactive to light. Right eye exhibits no discharge. Left eye exhibits no discharge. No scleral icterus.  Neck: Neck supple.  Cardiovascular: Normal rate, regular rhythm and normal heart sounds.   Pulmonary/Chest: Effort normal and breath sounds normal. No respiratory distress. She has no wheezes. She has no rales.  Abdominal: Soft. She exhibits no distension and no mass. There is no tenderness. There is no rebound and no guarding.  Musculoskeletal: She exhibits no tenderness.  Neurological: She is alert and oriented to person, place, and time.  Cranial Nerves II-XII grossly intact  Skin: Skin is warm and dry. No rash noted.  Psychiatric: She has a normal mood and affect.  Nursing note and vitals reviewed.   ED Course  Procedures (including critical care time) Labs Review Labs Reviewed  URINALYSIS, ROUTINE W REFLEX MICROSCOPIC (NOT AT [facility]) - Abnormal; Notable for the following:    APPearance CLOUDY (*)  Hgb urine dipstick TRACE (*)    Leukocytes, UA MODERATE (*)    All other components within normal limits  URINE MICROSCOPIC-ADD ON - Abnormal; Notable for the following:    Squamous Epithelial / LPF FEW (*)    Crystals CA OXALATE CRYSTALS (*)    All other components within normal limits  PREGNANCY, URINE    Imaging Review No results found.   EKG Interpretation None     Meds given in ED:  Medications - No data to display  New Prescriptions   CEPHALEXIN (KEFLEX)  500 MG CAPSULE    Take 1 capsule (500 mg total) by mouth 4 (four) times daily.   Filed Vitals:   03/20/15 1515  BP: 146/90  Pulse: 74  Temp: 98.5 F (36.9 C)  TempSrc: Oral  Resp: 20  Height: 5\' 4"  (1.626 m)  Weight: 175 lb (79.379 kg)  SpO2: 100%    MDM  Vitals stable - WNL -afebrile Pt resting comfortably in ED. PE--normal abdominal exam. Physical exam is otherwise noncontributory. Labwork-evidence of UTI on urinalysis. Pregnancy negative.  DDX--patient with chronic abdominal discomfort, in no distress now in the ED. Symptoms may be secondary to UTI. Will treat with antibiotic. No evidence of acute or emergent pathology. Will have patient follow-up with primary care for further evaluation of this problem.  I discussed all relevant lab findings and imaging results with pt and they verbalized understanding. Discussed f/u with PCP within 48 hrs and return precautions, pt very amenable to plan.  Final diagnoses:  UTI (lower urinary tract infection)        Joycie PeekBenjamin Mackenzye Mackel, PA-C 03/20/15 1551  Geoffery Lyonsouglas Delo, MD 03/20/15 1616

## 2015-10-16 ENCOUNTER — Encounter (HOSPITAL_BASED_OUTPATIENT_CLINIC_OR_DEPARTMENT_OTHER): Payer: Self-pay | Admitting: Emergency Medicine

## 2015-10-16 ENCOUNTER — Emergency Department (HOSPITAL_BASED_OUTPATIENT_CLINIC_OR_DEPARTMENT_OTHER)
Admission: EM | Admit: 2015-10-16 | Discharge: 2015-10-16 | Disposition: A | Discharge status: Home / Self Care | Payer: Self-pay | Attending: Emergency Medicine | Admitting: Emergency Medicine

## 2015-10-16 DIAGNOSIS — N644 Mastodynia: Secondary | ICD-10-CM | POA: Insufficient documentation

## 2015-10-16 DIAGNOSIS — Z792 Long term (current) use of antibiotics: Secondary | ICD-10-CM | POA: Insufficient documentation

## 2015-10-16 DIAGNOSIS — Z3202 Encounter for pregnancy test, result negative: Secondary | ICD-10-CM | POA: Insufficient documentation

## 2015-10-16 DIAGNOSIS — F17200 Nicotine dependence, unspecified, uncomplicated: Secondary | ICD-10-CM | POA: Insufficient documentation

## 2015-10-16 LAB — URINALYSIS, ROUTINE W REFLEX MICROSCOPIC
BILIRUBIN URINE: NEGATIVE
GLUCOSE, UA: NEGATIVE mg/dL
Hgb urine dipstick: NEGATIVE
KETONES UR: NEGATIVE mg/dL
Nitrite: NEGATIVE
PH: 6.5 (ref 5.0–8.0)
Protein, ur: NEGATIVE mg/dL
Specific Gravity, Urine: 1.015 (ref 1.005–1.030)

## 2015-10-16 LAB — URINE MICROSCOPIC-ADD ON

## 2015-10-16 LAB — PREGNANCY, URINE: Preg Test, Ur: NEGATIVE

## 2015-10-16 NOTE — ED Notes (Signed)
Patient states that she is having pain to her bilateral breast x 1 week

## 2015-10-16 NOTE — Discharge Instructions (Signed)
You can take ibuprofen, available over the counter, as needed for pain according to label instructions.     Breast Tenderness Breast tenderness is a common problem for women of all ages. Breast tenderness may cause mild discomfort to severe pain. It has a variety of causes. Your health care provider will find out the likely cause of your breast tenderness by examining your breasts, asking you about symptoms, and ordering some tests. Breast tenderness usually does not mean you have breast cancer. HOME CARE INSTRUCTIONS  Breast tenderness often can be handled at home. You can try:  Getting fitted for a new bra that provides more support, especially during exercise.  Wearing a more supportive bra or sports bra while sleeping when your breasts are very tender.  If you have a breast injury, apply ice to the area:  Put ice in a plastic bag.  Place a towel between your skin and the bag.  Leave the ice on for 20 minutes, 2-3 times a day.  If your breasts are too full of milk as a result of breastfeeding, try:  Expressing milk either by hand or with a breast pump.  Applying a warm compress to the breasts for relief.  Taking over-the-counter pain relievers, if approved by your health care provider.  Taking other medicines that your health care provider prescribes. These may include antibiotic medicines or birth control pills. Over the long term, your breast tenderness might be eased if you:  Cut down on caffeine.  Reduce the amount of fat in your diet. Keep a log of the days and times when your breasts are most tender. This will help you and your health care provider find the cause of the tenderness and how to relieve it. Also, learn how to do breast exams at home. This will help you notice if you have an unusual growth or lump that could cause tenderness. SEEK MEDICAL CARE IF:   Any part of your breast is hard, red, and hot to the touch. This could be a sign of infection.  Fluid is  coming out of your nipples (and you are not breastfeeding). Especially watch for blood or pus.  You have a fever as well as breast tenderness.  You have a new or painful lump in your breast that remains after your menstrual period ends.  You have tried to take care of the pain at home, but it has not gone away.  Your breast pain is getting worse, or the pain is making it hard to do the things you usually do during your day.   This information is not intended to replace advice given to you by your health care provider. Make sure you discuss any questions you have with your health care provider.   Document Released: 08/07/2008 Document Revised: 04/27/2013 Document Reviewed: 03/24/2013 Elsevier Interactive Patient Education [employer]

## 2015-10-16 NOTE — ED Provider Notes (Signed)
CSN: [id number]     Arrival date & time 10/16/15  1315 History   First MD Initiated Contact with Patient 10/16/15 1436     Chief Complaint  Patient presents with  . Breast Pain     The history is provided by the patient. No language interpreter was used.   Alyssa Nelson is a 30 y.o. female who presents to the Emergency Department complaining of breast pain.  She reports bilateral breast pain for the last week. She has tenderness bilaterally, right greater than left. She stopped wearing her bra to see if this would help her symptoms. She denies any fevers, chest pain, shortness breath, nipple discharge, rash, injuries. She takes Depo-Provera, last dose was sometime in December. No prior similar symptoms. Sxs are mild and constant.    Past Medical History  Diagnosis Date  . Abnormal Pap smear    Past Surgical History  Procedure Laterality Date  . Wisdom tooth extraction      age 69   History reviewed. No pertinent family history. Social History  Substance Use Topics  . Smoking status: Current Every Day Smoker  . Smokeless tobacco: None  . Alcohol Use: Yes   OB History    Gravida Para Term Preterm AB TAB SAB Ectopic Multiple Living   0              Review of Systems  All other systems reviewed and are negative.     Allergies  Review of patient's allergies indicates no known allergies.  Home Medications   Prior to Admission medications   Medication Sig Start Date End Date Taking? Authorizing Provider  cephALEXin (KEFLEX) 500 MG capsule Take 1 capsule (500 mg total) by mouth 4 (four) times daily. 03/20/15   Joycie Peek, PA-C  medroxyPROGESTERone (DEPO-PROVERA) 150 MG/ML injection Inject 150 mg into the muscle every 3 (three) months.    Historical Provider, MD   BP 153/108 mmHg  Pulse 77  Temp(Src) 98.2 F (36.8 C) (Oral)  Resp 18  Ht  (1.651 m)  Wt 163 lb (73.936 kg)  BMI 27.12 kg/m2  SpO2 100%  LMP 09/10/2015 Physical Exam  Constitutional: She is  oriented to person, place, and time. She appears well-developed and well-nourished.  HENT:  Head: Normocephalic and atraumatic.  Cardiovascular: Normal rate and regular rhythm.   No murmur heard. Pulmonary/Chest: Effort normal and breath sounds normal. No respiratory distress.  Mild breast tenderness bilaterally with fibrocystic changes bilaterally, right greater than left. There is no dimpling or nipple discharge. No overlying rash.  Abdominal: Soft. There is no tenderness. There is no rebound and no guarding.  Musculoskeletal: She exhibits no edema or tenderness.  Neurological: She is alert and oriented to person, place, and time.  Skin: Skin is warm and dry.  Psychiatric: She has a normal mood and affect. Her behavior is normal.  Nursing note and vitals reviewed.   ED Course  Procedures (including critical care time) Labs Review Labs Reviewed  URINALYSIS, ROUTINE W REFLEX MICROSCOPIC (NOT AT [facility]) - Abnormal; Notable for the following:    APPearance CLOUDY (*)    Leukocytes, UA SMALL (*)    All other components within normal limits  URINE MICROSCOPIC-ADD ON - Abnormal; Notable for the following:    Squamous Epithelial / LPF 6-30 (*)    Bacteria, UA FEW (*)    All other components within normal limits  PREGNANCY, URINE    Imaging Review No results found. I have personally reviewed and evaluated these  images and lab results as part of my medical decision-making.   EKG Interpretation None      MDM   Final diagnoses:  Breast pain    Patient here for evaluation of breast tenderness. No evidence of infectious process on examination. Patient is not pregnant. Discussed with patient home care for breast pain with ibuprofen medicine available over-the-counter. Discussed follow-up with the health Department for continued symptoms, may require mammogram or ultrasound if she has continued or worsening symptoms.    Tilden Fossa, MD 10/16/15 1451

## 2016-07-13 ENCOUNTER — Encounter (HOSPITAL_BASED_OUTPATIENT_CLINIC_OR_DEPARTMENT_OTHER): Payer: Self-pay | Admitting: *Deleted

## 2016-07-13 ENCOUNTER — Emergency Department (HOSPITAL_BASED_OUTPATIENT_CLINIC_OR_DEPARTMENT_OTHER)
Admission: EM | Admit: 2016-07-13 | Discharge: 2016-07-13 | Disposition: A | Discharge status: Home / Self Care | Payer: Self-pay | Attending: Emergency Medicine | Admitting: Emergency Medicine

## 2016-07-13 ENCOUNTER — Emergency Department (HOSPITAL_BASED_OUTPATIENT_CLINIC_OR_DEPARTMENT_OTHER): Payer: Self-pay

## 2016-07-13 DIAGNOSIS — R112 Nausea with vomiting, unspecified: Secondary | ICD-10-CM

## 2016-07-13 DIAGNOSIS — R1084 Generalized abdominal pain: Secondary | ICD-10-CM

## 2016-07-13 DIAGNOSIS — R197 Diarrhea, unspecified: Secondary | ICD-10-CM | POA: Insufficient documentation

## 2016-07-13 DIAGNOSIS — R1110 Vomiting, unspecified: Secondary | ICD-10-CM

## 2016-07-13 DIAGNOSIS — F17200 Nicotine dependence, unspecified, uncomplicated: Secondary | ICD-10-CM | POA: Insufficient documentation

## 2016-07-13 LAB — COMPREHENSIVE METABOLIC PANEL
ALBUMIN: 4.1 g/dL (ref 3.5–5.0)
ALK PHOS: 53 U/L (ref 38–126)
ALT: 18 U/L (ref 14–54)
ANION GAP: 9 (ref 5–15)
AST: 25 U/L (ref 15–41)
BUN: 11 mg/dL (ref 6–20)
CO2: 24 mmol/L (ref 22–32)
CREATININE: 0.76 mg/dL (ref 0.44–1.00)
Calcium: 8.9 mg/dL (ref 8.9–10.3)
Chloride: 109 mmol/L (ref 101–111)
GFR calc Af Amer: >60 mL/min (ref >60)
GFR calc non Af Amer: >60 mL/min (ref >60)
GLUCOSE: 96 mg/dL (ref 65–99)
Potassium: 3.8 mmol/L (ref 3.5–5.1)
SODIUM: 142 mmol/L (ref 135–145)
Total Bilirubin: 0.2 mg/dL — ABNORMAL LOW (ref 0.3–1.2)
Total Protein: 7.4 g/dL (ref 6.5–8.1)

## 2016-07-13 LAB — CBC WITH DIFFERENTIAL/PLATELET
BASOS PCT: 0 %
Basophils Absolute: 0 10*3/uL (ref 0.0–0.1)
EOS ABS: 0.1 10*3/uL (ref 0.0–0.7)
Eosinophils Relative: 1 %
HEMATOCRIT: 38.7 % (ref 36.0–46.0)
HEMOGLOBIN: 12.9 g/dL (ref 12.0–15.0)
LYMPHS ABS: 1.8 10*3/uL (ref 0.7–4.0)
Lymphocytes Relative: 22 %
MCH: 31.2 pg (ref 26.0–34.0)
MCHC: 33.3 g/dL (ref 30.0–36.0)
MCV: 93.5 fL (ref 78.0–100.0)
MONOS PCT: 9 %
Monocytes Absolute: 0.7 10*3/uL (ref 0.1–1.0)
NEUTROS ABS: 5.3 10*3/uL (ref 1.7–7.7)
Neutrophils Relative %: 68 %
Platelets: 300 10*3/uL (ref 150–400)
RBC: 4.14 MIL/uL (ref 3.87–5.11)
RDW: 13.8 % (ref 11.5–15.5)
WBC: 7.8 10*3/uL (ref 4.0–10.5)

## 2016-07-13 LAB — URINALYSIS, ROUTINE W REFLEX MICROSCOPIC
Bilirubin Urine: NEGATIVE
GLUCOSE, UA: NEGATIVE mg/dL
Ketones, ur: NEGATIVE mg/dL
NITRITE: NEGATIVE
PH: 7 (ref 5.0–8.0)
Protein, ur: 30 mg/dL — AB
Specific Gravity, Urine: 1.026 (ref 1.005–1.030)

## 2016-07-13 LAB — PREGNANCY, URINE: Preg Test, Ur: NEGATIVE

## 2016-07-13 LAB — URINE MICROSCOPIC-ADD ON

## 2016-07-13 LAB — LIPASE, BLOOD: Lipase: 22 U/L (ref 11–51)

## 2016-07-13 MED ORDER — ONDANSETRON 4 MG PO TBDP
4.0000 mg | ORAL_TABLET | Freq: Once | ORAL | Status: AC
  Administered 2016-07-13: 4 mg via ORAL
  Filled 2016-07-13: qty 1

## 2016-07-13 MED ORDER — SODIUM CHLORIDE 0.9 % IV BOLUS (SEPSIS)
1000.0000 mL | Freq: Once | INTRAVENOUS | Status: AC
  Administered 2016-07-13: 1000 mL via INTRAVENOUS

## 2016-07-13 MED ORDER — ONDANSETRON HCL 4 MG/2ML IJ SOLN
4.0000 mg | Freq: Once | INTRAMUSCULAR | Status: AC
  Administered 2016-07-13: 4 mg via INTRAVENOUS
  Filled 2016-07-13: qty 2

## 2016-07-13 MED ORDER — GI COCKTAIL ~~LOC~~
30.0000 mL | Freq: Once | ORAL | Status: AC
  Administered 2016-07-13: 30 mL via ORAL
  Filled 2016-07-13: qty 30

## 2016-07-13 NOTE — ED Notes (Signed)
Pt vomiting in waiting area. No beds available at this time. Zofran per standing orders.

## 2016-07-13 NOTE — ED Notes (Signed)
Pt given d/c instructions as per chart. Verbalizes understanding. No questions. 

## 2016-07-13 NOTE — ED Triage Notes (Addendum)
[facility]  Reports that she continues to cough and is vomiting.  Reports that she had a negative pregnancy test on Friday.

## 2016-07-13 NOTE — ED Notes (Signed)
Patient transported to X-ray 

## 2016-07-13 NOTE — ED Provider Notes (Signed)
[facility] Provider Note   CSN: [id number] Arrival date & time: 07/13/16  1851  By signing my name below, I, Arianna Nassar, attest that this documentation has been prepared under the direction and in the presence of Nira ConnPedro Eduardo Cardama, MD.  Electronically Signed: Octavia HeirArianna Nassar, ED Scribe. 07/14/16. 1:40 AM.     History   Chief Complaint Chief Complaint  Patient presents with  . Emesis    The history is provided by the patient. No language interpreter was used.   HPI Comments: Alyssa Nelson is a 30 y.o. female who presents to the Emergency Department complaining of intermittent, gradual worsening emesis for the past 3 days. She has been vomiting emesis with blood streaks. Associated hematemesis x 1 ~ 5pm, chills, and diarrhea. Pt was seen at [facility] 11/03 for abdominal pain and vomiting and was diagnosed with a viral infection. She has lab work performed that came bak unremarkable. Pt says that she has been unable to keep down any fluids secondary to vomiting everything back up. She endorses constant chest discomfort, worsened with emesis for 4 days. Denies fever and shortness of breath.    Past Medical History:  Diagnosis Date  . Abnormal Pap smear     There are no active problems to display for this patient.   Past Surgical History:  Procedure Laterality Date  . WISDOM TOOTH EXTRACTION     age 89    OB History    Gravida Para Term Preterm AB Living   0             SAB TAB Ectopic Multiple Live Births                   Home Medications    Prior to Admission medications   Not on File    Family History History reviewed. No pertinent family history.  Social History Social History  Substance Use Topics  . Smoking status: Current Every Day Smoker  . Smokeless tobacco: Not on file  . Alcohol use Yes     Allergies   Patient has no known allergies.   Review of Systems Review of Systems  All other systems reviewed and are  negative.   A complete 10 system review of systems was obtained and all systems are negative except as noted in the HPI and PMH.    Physical Exam Updated Vital Signs BP 145/96 (BP Location: Left Arm)   Pulse 67   Temp 98.2 F (36.8 C) (Oral)   Resp 22   Ht 5\' 4"  (1.626 m)   Wt 173 lb (78.5 kg)   LMP 06/12/2016   SpO2 100%   BMI 29.70 kg/m   Physical Exam  Constitutional: She is oriented to person, place, and time. She appears well-developed and well-nourished. No distress.  HENT:  Head: Normocephalic and atraumatic.  Nose: Nose normal.  Eyes: Conjunctivae and EOM are normal. Pupils are equal, round, and reactive to light. Right eye exhibits no discharge. Left eye exhibits no discharge. No scleral icterus.  Neck: Normal range of motion. Neck supple.  Cardiovascular: Normal rate and regular rhythm.  Exam reveals no gallop and no friction rub.   No murmur heard. Pulmonary/Chest: Effort normal and breath sounds normal. No stridor. No respiratory distress. She has no rales.  Abdominal: Soft. She exhibits no distension. There is no tenderness.  Mild discomfort on exam.  Musculoskeletal: She exhibits no edema or tenderness.  Neurological: She is alert and oriented to person, place,  and time.  Skin: Skin is warm and dry. No rash noted. She is not diaphoretic. No erythema.  Psychiatric: She has a normal mood and affect.  Nursing note and vitals reviewed.    ED Treatments / Results  DIAGNOSTIC STUDIES: Oxygen Saturation is 100% on RA, normal by my interpretation.  COORDINATION OF CARE:  8:23 PM Discussed treatment plan with pt at bedside and pt agreed to plan.  Labs (all labs ordered are listed, but only abnormal results are displayed) Labs Reviewed  URINALYSIS, ROUTINE W REFLEX MICROSCOPIC (NOT AT [facility]) - Abnormal; Notable for the following:       Result Value   APPearance CLOUDY (*)    Hgb urine dipstick MODERATE (*)    Protein, ur 30 (*)    Leukocytes, UA LARGE (*)     All other components within normal limits  COMPREHENSIVE METABOLIC PANEL - Abnormal; Notable for the following:    Total Bilirubin 0.2 (*)    All other components within normal limits  URINE MICROSCOPIC-ADD ON - Abnormal; Notable for the following:    Squamous Epithelial / LPF 6-30 (*)    Bacteria, UA MANY (*)    All other components within normal limits  PREGNANCY, URINE  CBC WITH DIFFERENTIAL/PLATELET  LIPASE, BLOOD    EKG  EKG Interpretation  Date/Time:  Sunday July 13 2016 21:37:19 EST Ventricular Rate:  63 PR Interval:    QRS Duration: 92 QT Interval:  398 QTC Calculation: 408 R Axis:   32 Text Interpretation:  Sinus rhythm Probable left atrial enlargement No significant change since last tracing Confirmed by [facility]ARDAMA MD, PEDRO ([id number]) on 07/14/2016 1:40:45 AM       Radiology Dg Chest 2 View  Result Date: 07/13/2016 CLINICAL DATA:  Chest pain and dyspnea for 3 days. Smoker for 10 years. EXAM: CHEST  2 VIEW COMPARISON:  10/01/2013 FINDINGS: The heart size and mediastinal contours are within normal limits. Both lungs are clear. The visualized skeletal structures are unremarkable. IMPRESSION: No active cardiopulmonary disease. Electronically Signed   By: Tollie Ethavid  Kwon M.D.   On: 07/13/2016 21:05    Procedures Procedures (including critical care time)  Medications Ordered in ED Medications  ondansetron (ZOFRAN-ODT) disintegrating tablet 4 mg (4 mg Oral Given 07/13/16 1916)  sodium chloride 0.9 % bolus 1,000 mL (0 mLs Intravenous Stopped 07/13/16 2236)  ondansetron (ZOFRAN) injection 4 mg (4 mg Intravenous Given 07/13/16 2045)  gi cocktail (Maalox,Lidocaine,Donnatal) (30 mLs Oral Given 07/13/16 2044)     Initial Impression / Assessment and Plan / ED Course  I have reviewed the triage vital signs and the nursing notes.  Pertinent labs & imaging results that were available during my care of the patient were reviewed by me and considered in my medical decision making (see  chart for details).  Clinical Course     Labs grossly reassuring. Patient treated symptomatically with IV fluids and antiemetic. Patient also given GI cocktail which had significant improvement of her symptoms. Able to tolerate by mouth. Abdomen not pertinent headache. Low suspicion for acute cholecystitis, pancreatitis, appendicitis, lightheadedness, small bowel obstruction. UPT negative. UA contaminated; symptoms not suggestive of urinary tract infection.  Chest x-ray without evidence of pneumomediastinum suggestive of perforated esophagus. Likely Mallory-Weiss tear.   Safe for discharge with strict return precautions. Final Clinical Impressions(s) / ED Diagnoses   Final diagnoses:  Emesis  Nausea vomiting and diarrhea  Generalized abdominal pain   I personally performed the services described in this documentation, which was scribed in  my presence. The recorded information has been reviewed and is accurate.   Disposition: Discharge  Condition: Good  I have discussed the results, Dx and Tx plan with the patient who expressed understanding and agree(s) with the plan. Discharge instructions discussed at great length. The patient was given strict return precautions who verbalized understanding of the instructions. No further questions at time of discharge.    There are no discharge medications for this patient.   Follow Up: primary care provider  Schedule an appointment as soon as possible for a visit  in 3-5 days, If symptoms do not improve or  worsen       Nira Conn, MD 07/14/16 [phone]

## 2016-07-13 NOTE — ED Notes (Signed)
Pt states she feels well enough to go home. EDP notified.

## 2017-11-19 ENCOUNTER — Emergency Department (HOSPITAL_COMMUNITY)
Admission: EM | Admit: 2017-11-19 | Discharge: 2017-11-20 | Disposition: A | Discharge status: Home / Self Care | Payer: No Typology Code available for payment source | Attending: Emergency Medicine | Admitting: Emergency Medicine

## 2017-11-19 ENCOUNTER — Emergency Department (HOSPITAL_COMMUNITY): Payer: No Typology Code available for payment source

## 2017-11-19 ENCOUNTER — Encounter (HOSPITAL_COMMUNITY): Payer: Self-pay | Admitting: Emergency Medicine

## 2017-11-19 DIAGNOSIS — S9000XA Contusion of unspecified ankle, initial encounter: Secondary | ICD-10-CM | POA: Diagnosis not present

## 2017-11-19 DIAGNOSIS — R55 Syncope and collapse: Secondary | ICD-10-CM | POA: Insufficient documentation

## 2017-11-19 DIAGNOSIS — Y939 Activity, unspecified: Secondary | ICD-10-CM | POA: Diagnosis not present

## 2017-11-19 DIAGNOSIS — Y92410 Unspecified street and highway as the place of occurrence of the external cause: Secondary | ICD-10-CM | POA: Diagnosis not present

## 2017-11-19 DIAGNOSIS — R109 Unspecified abdominal pain: Secondary | ICD-10-CM | POA: Diagnosis not present

## 2017-11-19 DIAGNOSIS — Y999 Unspecified external cause status: Secondary | ICD-10-CM | POA: Insufficient documentation

## 2017-11-19 DIAGNOSIS — R0789 Other chest pain: Secondary | ICD-10-CM | POA: Diagnosis not present

## 2017-11-19 DIAGNOSIS — T148XXA Other injury of unspecified body region, initial encounter: Secondary | ICD-10-CM

## 2017-11-19 DIAGNOSIS — F10920 Alcohol use, unspecified with intoxication, uncomplicated: Secondary | ICD-10-CM

## 2017-11-19 LAB — COMPREHENSIVE METABOLIC PANEL
ALK PHOS: 64 U/L (ref 38–126)
ALT: 12 U/L — ABNORMAL LOW (ref 14–54)
AST: 22 U/L (ref 15–41)
Albumin: 3.7 g/dL (ref 3.5–5.0)
Anion gap: 12 (ref 5–15)
BILIRUBIN TOTAL: 0.4 mg/dL (ref 0.3–1.2)
CALCIUM: 7.8 mg/dL — AB (ref 8.9–10.3)
CO2: 17 mmol/L — ABNORMAL LOW (ref 22–32)
CREATININE: 0.79 mg/dL (ref 0.44–1.00)
Chloride: 116 mmol/L — ABNORMAL HIGH (ref 101–111)
GFR calc Af Amer: >60 mL/min (ref >60)
Glucose, Bld: 83 mg/dL (ref 65–99)
Potassium: 3.2 mmol/L — ABNORMAL LOW (ref 3.5–5.1)
Sodium: 145 mmol/L (ref 135–145)
Total Protein: 6.5 g/dL (ref 6.5–8.1)

## 2017-11-19 LAB — CBC
HCT: 39.3 % (ref 36.0–46.0)
Hemoglobin: 12.9 g/dL (ref 12.0–15.0)
MCH: 30.7 pg (ref 26.0–34.0)
MCHC: 32.8 g/dL (ref 30.0–36.0)
MCV: 93.6 fL (ref 78.0–100.0)
PLATELETS: 291 10*3/uL (ref 150–400)
RBC: 4.2 MIL/uL (ref 3.87–5.11)
RDW: 13.3 % (ref 11.5–15.5)
WBC: 5.6 10*3/uL (ref 4.0–10.5)

## 2017-11-19 LAB — I-STAT CHEM 8, ED
CREATININE: 0.9 mg/dL (ref 0.44–1.00)
Calcium, Ion: 0.99 mmol/L — ABNORMAL LOW (ref 1.15–1.40)
Chloride: 113 mmol/L — ABNORMAL HIGH (ref 101–111)
Glucose, Bld: 80 mg/dL (ref 65–99)
HEMATOCRIT: 39 % (ref 36.0–46.0)
Hemoglobin: 13.3 g/dL (ref 12.0–15.0)
Potassium: 3.2 mmol/L — ABNORMAL LOW (ref 3.5–5.1)
Sodium: 147 mmol/L — ABNORMAL HIGH (ref 135–145)
TCO2: 18 mmol/L — ABNORMAL LOW (ref 22–32)

## 2017-11-19 LAB — CDS SEROLOGY

## 2017-11-19 LAB — ETHANOL: Alcohol, Ethyl (B): 251 mg/dL — ABNORMAL HIGH (ref <10)

## 2017-11-19 LAB — I-STAT BETA HCG BLOOD, ED (MC, WL, AP ONLY): I-stat hCG, quantitative: <5 m[IU]/mL (ref <5)

## 2017-11-19 LAB — PROTIME-INR
INR: 1.11
Prothrombin Time: 14.2 seconds (ref 11.4–15.2)

## 2017-11-19 LAB — I-STAT CG4 LACTIC ACID, ED: Lactic Acid, Venous: 3.84 mmol/L (ref 0.5–1.9)

## 2017-11-19 MED ORDER — FENTANYL CITRATE (PF) 100 MCG/2ML IJ SOLN
INTRAMUSCULAR | Status: AC
  Filled 2017-11-19: qty 2

## 2017-11-19 MED ORDER — SODIUM CHLORIDE 0.9 % IV SOLN
INTRAVENOUS | Status: DC
  Administered 2017-11-19: 23:00:00 via INTRAVENOUS

## 2017-11-19 MED ORDER — POTASSIUM CHLORIDE CRYS ER 20 MEQ PO TBCR
40.0000 meq | EXTENDED_RELEASE_TABLET | Freq: Once | ORAL | Status: AC
  Administered 2017-11-19: 40 meq via ORAL
  Filled 2017-11-19: qty 2

## 2017-11-19 MED ORDER — IOPAMIDOL (ISOVUE-300) INJECTION 61%
INTRAVENOUS | Status: AC
  Administered 2017-11-19: 100 mL
  Filled 2017-11-19: qty 100

## 2017-11-19 MED ORDER — TETANUS-DIPHTH-ACELL PERTUSSIS 5-2.5-18.5 LF-MCG/0.5 IM SUSP
INTRAMUSCULAR | Status: AC
  Filled 2017-11-19: qty 0.5

## 2017-11-19 MED ORDER — FENTANYL CITRATE (PF) 100 MCG/2ML IJ SOLN
50.0000 ug | Freq: Once | INTRAMUSCULAR | Status: AC
  Administered 2017-11-19: 50 ug via INTRAVENOUS

## 2017-11-19 MED ORDER — TETANUS-DIPHTH-ACELL PERTUSSIS 5-2.5-18.5 LF-MCG/0.5 IM SUSP
0.5000 mL | Freq: Once | INTRAMUSCULAR | Status: AC
  Administered 2017-11-19: 0.5 mL via INTRAMUSCULAR

## 2017-11-19 MED ORDER — SODIUM CHLORIDE 0.9 % IV BOLUS (SEPSIS)
1000.0000 mL | Freq: Once | INTRAVENOUS | Status: AC
  Administered 2017-11-19: 1000 mL via INTRAVENOUS

## 2017-11-19 NOTE — ED Notes (Signed)
Patient returned from CT and xray.

## 2017-11-19 NOTE — Progress Notes (Signed)
Orthopedic Tech Progress Note Patient Details:  Alyssa Nelson Dec 11, 1985 [id number] Level 2 trauma ortho visit. Patient ID: Alyssa KehrSarah Nelson, female   DOB: Dec 11, 1985, 32 y.o.   MRN: [id number]   [name], Alyssa Nelson 11/19/2017, 9:41 PM
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
## 2017-11-19 NOTE — ED Notes (Signed)
To CT and Xray 
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
## 2017-11-20 LAB — SAMPLE TO BLOOD BANK

## 2017-11-20 MED ORDER — NAPROXEN 500 MG PO TABS: 500.0000 mg | ORAL_TABLET | Freq: Two times a day (BID) | ORAL | 0 refills | Status: DC | PRN

## 2017-11-20 MED ORDER — HYDROCODONE-ACETAMINOPHEN 5-325 MG PO TABS
1.0000 | ORAL_TABLET | Freq: Once | ORAL | Status: AC
  Administered 2017-11-20: 1 via ORAL
  Filled 2017-11-20: qty 1

## 2017-11-20 NOTE — ED Provider Notes (Signed)
Delay in x-ray of left ankle.  Patient ambulating without difficulty.  Ready for discharge.  Decision made with Dr. Manus Gunningancour to discharge.  Dr. Manus Gunningancour reviewed single available left ankle film.   Roxy HorsemanBrowning, Karlon Schlafer, PA-C 11/20/17 0126    Glynn Octaveancour, Stephen, MD 11/20/17 [phone]
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
## 2018-09-02 IMAGING — CR DG FEMUR 2+V*R*
4 series · 4 of 4 positions shown · non-contrast
Comparison: None.

CLINICAL DATA: Right femur

EXAM:
RIGHT FEMUR 2 VIEWS

[femur ap (1 of 2)]
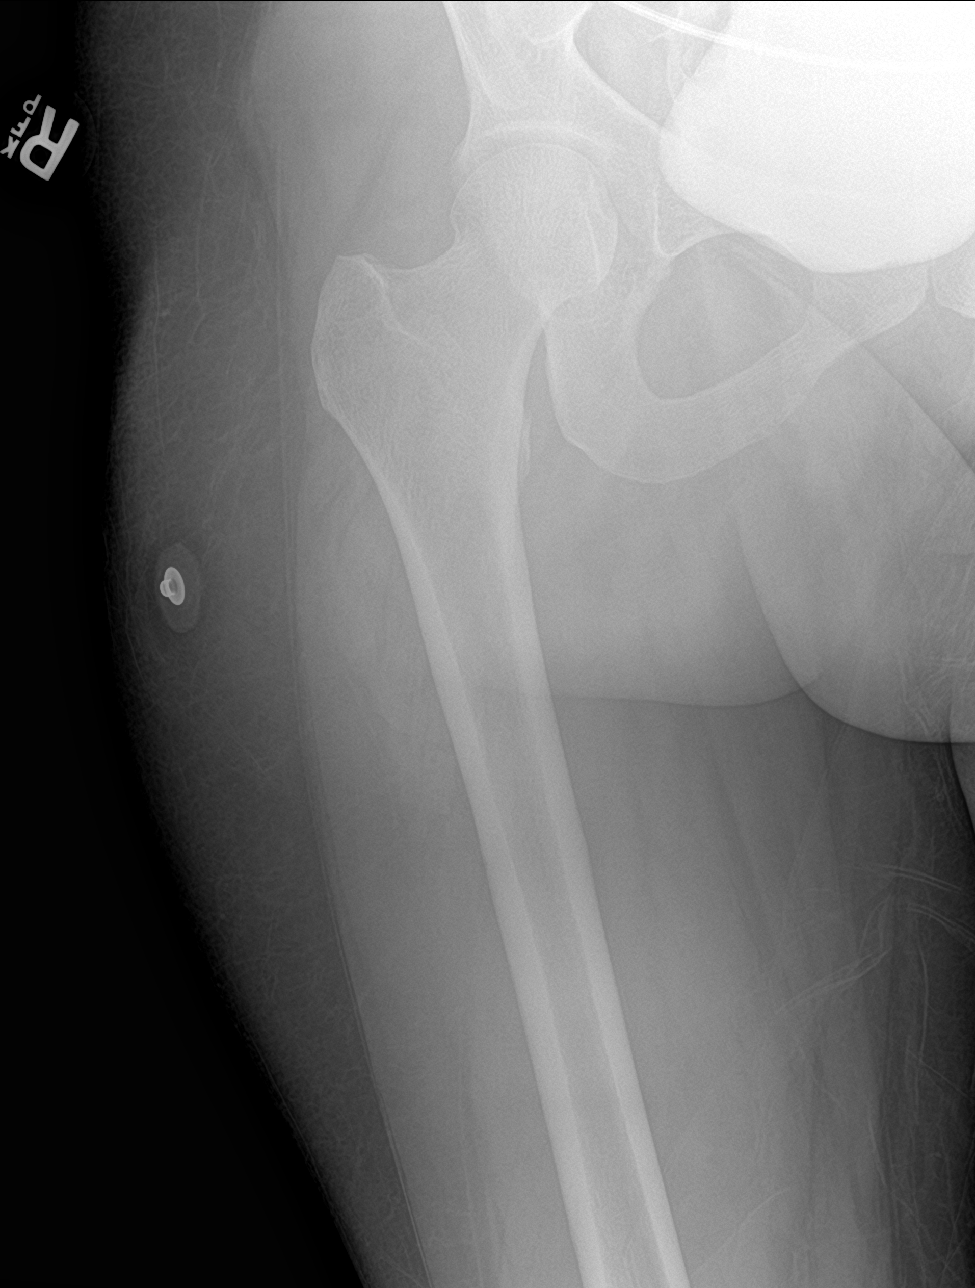

[femur ap (2 of 2)]
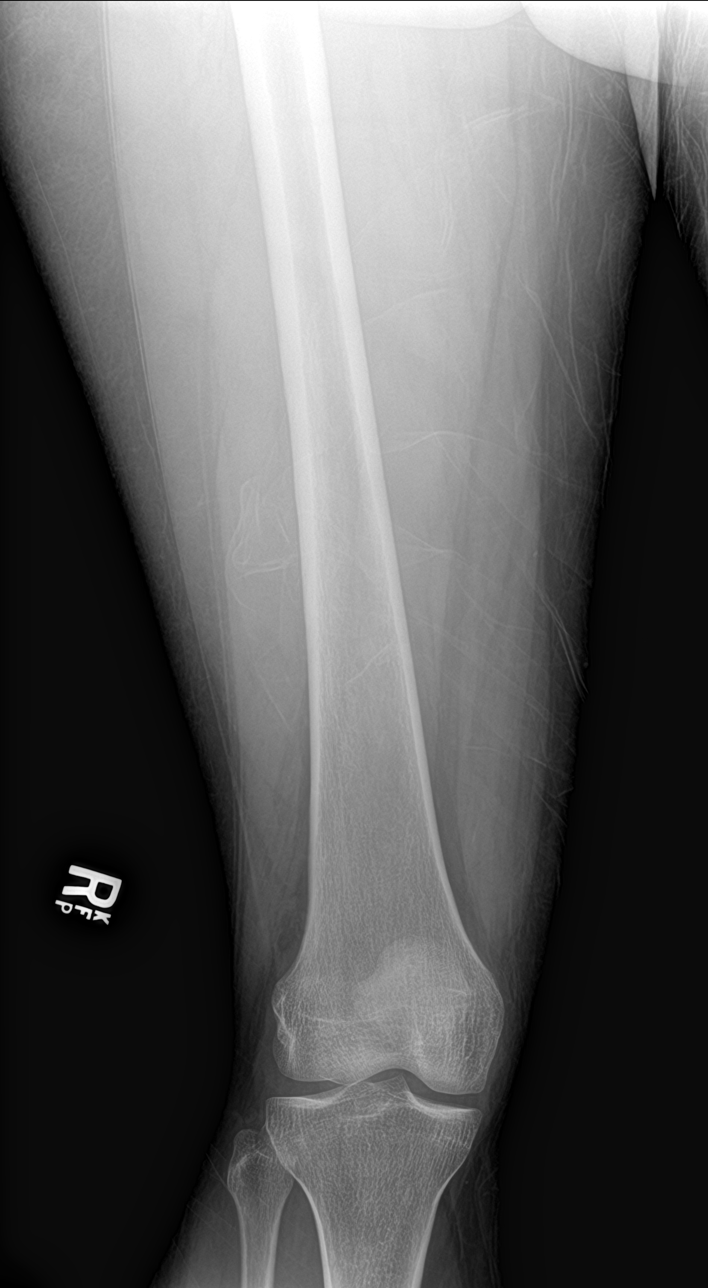

[femur lat (1 of 2)]
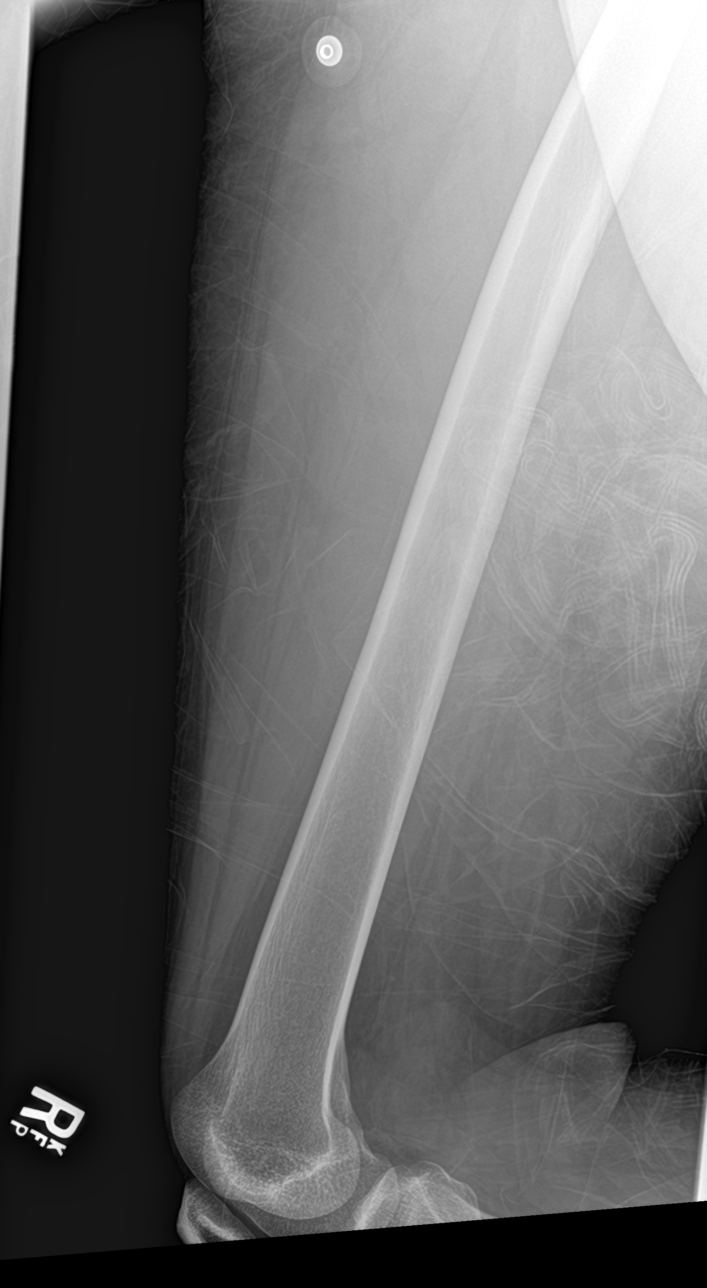

[femur lat (2 of 2)]
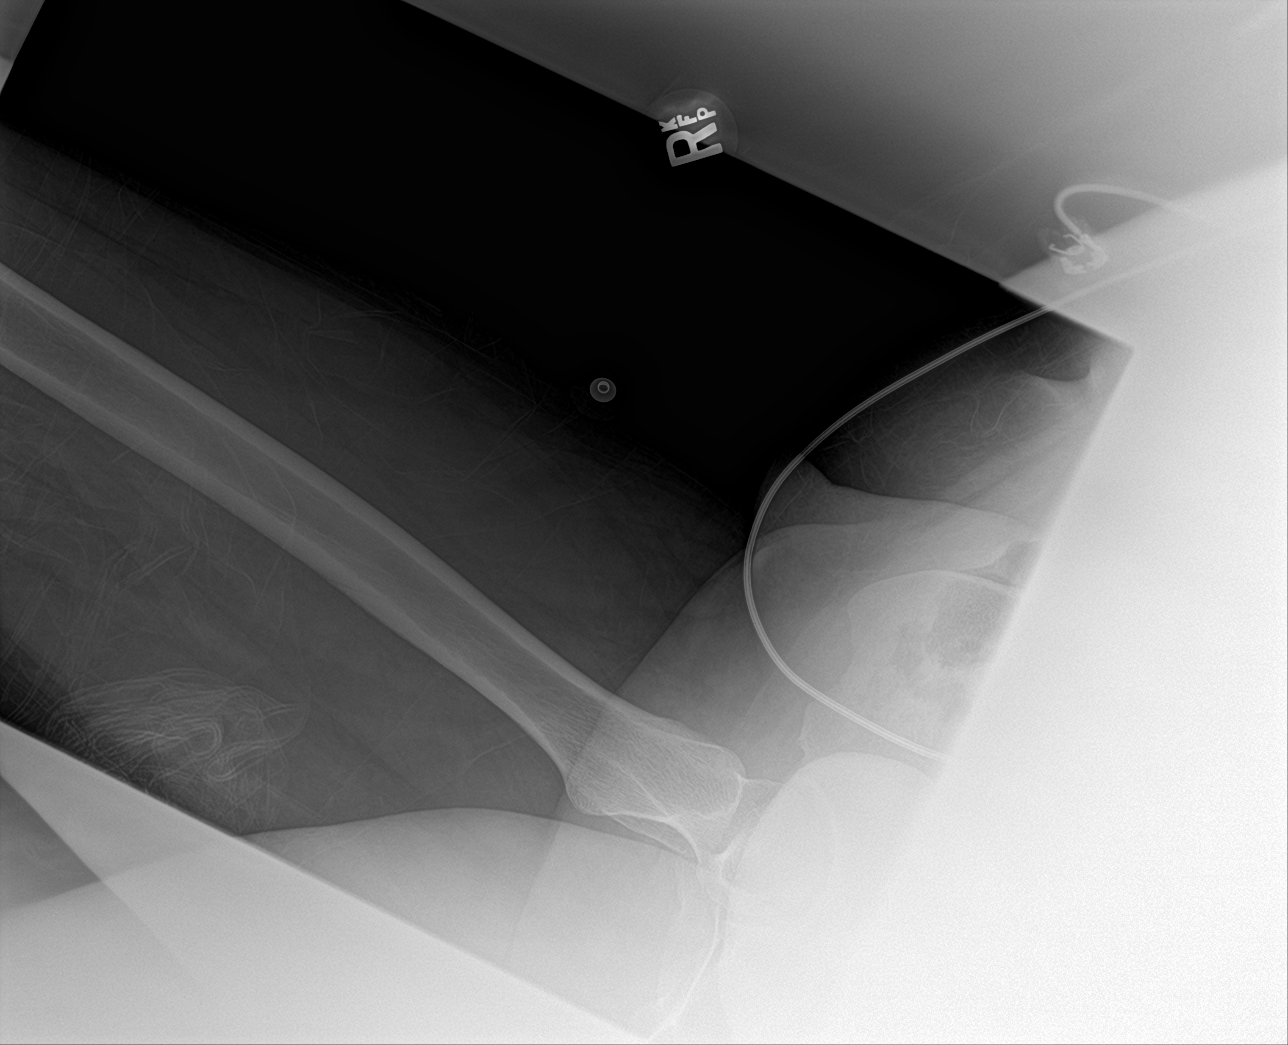

[4 of 4 positions shown; findings below may reference images not displayed]

FINDINGS: Overlapping AP and frog-leg views are provided. There is no evidence
of fracture or other focal bone lesions. Soft tissues are
unremarkable.
IMPRESSION: Negative.

## 2019-09-07 ENCOUNTER — Emergency Department (HOSPITAL_BASED_OUTPATIENT_CLINIC_OR_DEPARTMENT_OTHER)
Admission: EM | Admit: 2019-09-07 | Discharge: 2019-09-07 | Disposition: A | Discharge status: Home / Self Care | Payer: Self-pay | Attending: Emergency Medicine | Admitting: Emergency Medicine

## 2019-09-07 ENCOUNTER — Encounter (HOSPITAL_BASED_OUTPATIENT_CLINIC_OR_DEPARTMENT_OTHER): Payer: Self-pay

## 2019-09-07 ENCOUNTER — Other Ambulatory Visit: Payer: Self-pay

## 2019-09-07 DIAGNOSIS — Z113 Encounter for screening for infections with a predominantly sexual mode of transmission: Secondary | ICD-10-CM

## 2019-09-07 DIAGNOSIS — Z79899 Other long term (current) drug therapy: Secondary | ICD-10-CM | POA: Insufficient documentation

## 2019-09-07 DIAGNOSIS — B9689 Other specified bacterial agents as the cause of diseases classified elsewhere: Secondary | ICD-10-CM

## 2019-09-07 DIAGNOSIS — F17200 Nicotine dependence, unspecified, uncomplicated: Secondary | ICD-10-CM | POA: Insufficient documentation

## 2019-09-07 DIAGNOSIS — N760 Acute vaginitis: Secondary | ICD-10-CM | POA: Insufficient documentation

## 2019-09-07 LAB — WET PREP, GENITAL
Sperm: NONE SEEN
Trich, Wet Prep: NONE SEEN
Yeast Wet Prep HPF POC: NONE SEEN

## 2019-09-07 LAB — URINALYSIS, MICROSCOPIC (REFLEX)

## 2019-09-07 LAB — URINALYSIS, ROUTINE W REFLEX MICROSCOPIC
Bilirubin Urine: NEGATIVE
Glucose, UA: NEGATIVE mg/dL
Ketones, ur: NEGATIVE mg/dL
Leukocytes,Ua: NEGATIVE
Nitrite: NEGATIVE
Protein, ur: NEGATIVE mg/dL
Specific Gravity, Urine: <1.005 — ABNORMAL LOW (ref 1.005–1.030)
pH: 6.5 (ref 5.0–8.0)

## 2019-09-07 LAB — PREGNANCY, URINE: Preg Test, Ur: NEGATIVE

## 2019-09-07 LAB — HIV ANTIBODY (ROUTINE TESTING W REFLEX): HIV Screen 4th Generation wRfx: NONREACTIVE

## 2019-09-07 MED ORDER — METRONIDAZOLE 500 MG PO TABS: 500.0000 mg | ORAL_TABLET | Freq: Two times a day (BID) | ORAL | 0 refills | Status: DC

## 2019-09-07 MED FILL — METRONIDAZOLE 500 MG TABS: 500 | 7 days supply | Qty: 14 | Fill #0

## 2019-09-07 NOTE — Discharge Instructions (Addendum)
Your pelvic exam came back positive for bacterial vaginosis. Please see attached info for more information on the topic. We will treat with   You were tested for gonorrhea and chlamydia in the ER but the hospital will call you if lab is positive. You were tested for HIV and Syphilis, and the hospital will call you if the lab is positive.   NO SEXUAL INTERCOURSE FOR AT LEAST 10 DAYS AFTER TODAY'S VISIT. DO NOT ENGAGE IN SEXUAL ACTIVITY UNTIL YOU FIND OUT ABOUT YOUR RESULTS AND HAVE PARTNERS TESTED AND TREATED. ALL PARTNERS MUST BE TESTED AND TREATED FOR STD'S. ALWAYS USE CONDOMS WHEN ENGAGING IN INTERCOURSE.   Follow up with [facility] STD clinic for future STD concerns or screenings.
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
## 2019-09-07 NOTE — ED Notes (Signed)
Pt provided urine specimen, sent to lab
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
## 2019-09-08 ENCOUNTER — Telehealth (HOSPITAL_COMMUNITY): Payer: Self-pay

## 2019-09-08 LAB — RPR: RPR Ser Ql: NONREACTIVE

## 2019-09-08 LAB — GC/CHLAMYDIA PROBE AMP (~~LOC~~) NOT AT ARMC
Chlamydia: POSITIVE — AB
Neisseria Gonorrhea: NEGATIVE

## 2019-09-26 ENCOUNTER — Telehealth (HOSPITAL_COMMUNITY): Payer: Self-pay

## 2019-09-26 NOTE — Telephone Encounter (Signed)
Telephoned patient at home number. Left a message to call and schedule with [facility]. 

## 2019-10-11 ENCOUNTER — Encounter (HOSPITAL_COMMUNITY): Payer: Self-pay

## 2019-10-11 ENCOUNTER — Other Ambulatory Visit: Payer: Self-pay

## 2019-10-11 ENCOUNTER — Ambulatory Visit (HOSPITAL_COMMUNITY)
Admission: RE | Admit: 2019-10-11 | Discharge: 2019-10-11 | Disposition: A | Discharge status: Home / Self Care | Payer: Self-pay | Source: Ambulatory Visit | Attending: Obstetrics and Gynecology | Admitting: Obstetrics and Gynecology

## 2019-10-11 DIAGNOSIS — R87612 Low grade squamous intraepithelial lesion on cytologic smear of cervix (LGSIL): Secondary | ICD-10-CM

## 2019-10-11 DIAGNOSIS — Z1239 Encounter for other screening for malignant neoplasm of breast: Secondary | ICD-10-CM

## 2019-10-11 NOTE — Progress Notes (Signed)
Patient referred to [facility] by the [facility] due to having an abnormal Pap smear on 06/09/2019 that a colposcopy is recommended for follow-up.  Pap Smear: Pap smear not completed today. Last Pap smear was 06/09/2019 at the [facility] and [facility]. Referred patient to the Center for [facility] for a colposcopy to follow-up for her abnormal Pap smear. Appointment scheduled for Thursday, October 27, 2019 at 0855. Per patient has a history of one other abnormal Pap smear around 2010 or 2011 that a repeat Pap smear was completed for follow-up that was normal.. Last Pap smear result is in Epic.  Physical exam: Breasts Breasts symmetrical. No skin abnormalities bilateral breasts. No nipple retraction bilateral breasts. No nipple discharge bilateral breasts. No lymphadenopathy. No lumps palpated bilateral breasts. No complaints of pain or tenderness on exam. Screening mammogram recommended at age 59 unless clinically indicated prior.  Pelvic/Bimanual No Pap smear completed today since last Pap smear was 06/09/2019. Pap smear not indicated per [facility] guidelines.   Smoking History: Patient is a current smoker. Discussed smoking cessation with patient. Referred patient to the [facility] Quitline.  Patient Navigation: Patient education provided. Access to services provided for patient through [facility] program.   Breast and Cervical Cancer Risk Assessment: Patient has no family history of breast cancer, known genetic mutations, or radiation treatment to the chest before age 74. Patient has no history of cervical dysplasia, immunocompromised, or DES exposure in-utero. Breast cancer risk assessment completed. No breast cancer risk calculated due to patient is less than 71 years old.

## 2019-10-11 NOTE — Addendum Note (Signed)
Encounter addended by: Priscille Heidelberg, RN on: 10/11/2019 7:20 PM  Actions taken: Clinical Note Signed

## 2019-10-11 NOTE — Patient Instructions (Addendum)
Explained breast self awareness with Alyssa Nelson. Patient did not need a Pap smear today due to last Pap smear was 06/09/2019. Explained the colposcopy the recommended follow-up for her abnormal Pap smear. Referred patient to the [facility] for a colposcopy to follow-up for her abnormal Pap smear. Appointment scheduled for Thursday, October 27, 2019 at 0855. Patient aware of appointment and will be there. Discussed smoking cessation with patient. Referred patient to the [facility] Quitline.Let patient know a screening mammogram is recommended at age 24 unless clinically indicated prior. Alyssa Nelson verbalized understanding.  [name], [name], RN 9:05 AM

## 2019-10-27 ENCOUNTER — Ambulatory Visit: Payer: Self-pay | Admitting: Family Medicine

## 2019-11-08 ENCOUNTER — Telehealth: Payer: Self-pay | Admitting: Obstetrics and Gynecology

## 2019-11-08 NOTE — Telephone Encounter (Signed)
Attempted to reach patient about her appointment needing to be rescheduled due to lack of having needed equipment to have appointment.

## 2019-11-09 ENCOUNTER — Telehealth: Payer: Self-pay | Admitting: Family Medicine

## 2019-11-09 NOTE — Telephone Encounter (Signed)
Attempted to reach patient about her appointment. I was not able to leave a message because her mailbox was full.

## 2019-11-10 ENCOUNTER — Encounter: Payer: Self-pay | Admitting: Family Medicine

## 2019-11-10 ENCOUNTER — Ambulatory Visit: Payer: Self-pay | Admitting: Obstetrics and Gynecology

## 2019-11-14 ENCOUNTER — Other Ambulatory Visit: Payer: Self-pay

## 2019-11-14 ENCOUNTER — Encounter: Payer: Self-pay | Admitting: Family Medicine

## 2019-11-14 ENCOUNTER — Other Ambulatory Visit (HOSPITAL_COMMUNITY)
Admission: RE | Admit: 2019-11-14 | Discharge: 2019-11-14 | Disposition: A | Discharge status: Home / Self Care | Payer: Self-pay | Source: Ambulatory Visit | Attending: Family Medicine | Admitting: Family Medicine

## 2019-11-14 ENCOUNTER — Encounter: Payer: Self-pay | Admitting: Obstetrics and Gynecology

## 2019-11-14 ENCOUNTER — Ambulatory Visit (INDEPENDENT_AMBULATORY_CARE_PROVIDER_SITE_OTHER): Payer: Self-pay | Admitting: Family Medicine

## 2019-11-14 VITALS — BP 123/78 | HR 68 | Wt 149.4 lb

## 2019-11-14 DIAGNOSIS — Z8742 Personal history of other diseases of the female genital tract: Secondary | ICD-10-CM

## 2019-11-14 DIAGNOSIS — N871 Moderate cervical dysplasia: Secondary | ICD-10-CM

## 2019-11-14 DIAGNOSIS — R87612 Low grade squamous intraepithelial lesion on cytologic smear of cervix (LGSIL): Secondary | ICD-10-CM

## 2019-11-14 LAB — POCT PREGNANCY, URINE: Preg Test, Ur: NEGATIVE

## 2019-11-14 NOTE — Progress Notes (Signed)
Colposcopy Procedure Note  Alyssa Nelson is a 34 y.o. G0P0 here for colposcopy.  Indications:  Last Pap smear was 06/09/2019 at the [facility] and [facility] (no HPV testing done)  Procedure Details  LMP 11/06/19; UPT negative.    The risks (including infection, bleeding, pain) and benefits of the procedure were explained to the patient and written informed consent was obtained.  The patient was placed in the dorsal lithotomy position. A Graves was speculum inserted in the vagina, and the cervix was visualized.  The cervix was stained with acetic acid and visualized using the colposcope under magnification as well as with a green filter. Findings as below. Cervical biopsies were taken at  6 o'clock and 12 o'clock. Endocervical curettage then performed in all four quadrants. Small amount of bleeding noted that improved with pressure. Monsel's solution applied with good hemostasis noted. Patient tolerating procedure well.  Findings: Cobblestoning and hypervascularity seen with protrusion of lesions into the cervical Os.  Impression: CIN 2  Adequate: yes  Specimens: Cervical biopsies at 6 and 12 o'clock, ECC done  Condition: Stable  Complications: None  Plan: The patient was advised to call for any fever or for prolonged or severe pain or bleeding. She was advised to use OTC analgesics as needed for mild to moderate pain. She was advised to avoid vaginal intercourse for 48 hours or until the bleeding has completely stopped.  Will base further management on results of biopsy.  Marlowe Alt, DO OB Fellow, Faculty Practice 11/14/2019 2:46 PM

## 2019-11-14 NOTE — Patient Instructions (Addendum)
Colposcopy, Care After This sheet gives you information about how to care for yourself after your procedure. Your doctor may also give you more specific instructions. If you have problems or questions, contact your doctor. What can I expect after the procedure? If you did not have a tissue sample removed (did not have a biopsy), you may only have some spotting for a few days. You can go back to your normal activities. If you had a tissue sample removed, it is common to have:  Soreness and pain. This may last for a few days.  Light-headedness.  Mild bleeding from your vagina or dark-colored, grainy discharge from your vagina. This may last for a few days. You may need to wear a sanitary pad.  Spotting for at least 48 hours after the procedure. Follow these instructions at home:   Take over-the-counter and prescription medicines only as told by your doctor. Ask your doctor what medicines you can start taking again. This is very important if you take blood-thinning medicine.  Do not drive or use heavy machinery while taking prescription pain medicine.  For 3 days, or as long as your doctor tells you, avoid: ? Douching. ? Using tampons. ? Having sex.  If you use birth control (contraception), keep using it.  Limit activity for the first day after the procedure. Ask your doctor what activities are safe for you.  It is up to you to get the results of your procedure. Ask your doctor when your results will be ready.  Keep all follow-up visits as told by your doctor. This is important. Contact a doctor if:  You get a skin rash. Get help right away if:  You are bleeding a lot from your vagina. It is a lot of bleeding if you are using more than one pad an hour for 2 hours in a row.  You have clumps of blood (blood clots) coming from your vagina.  You have a fever.  You have chills  You have pain in your lower belly (pelvic area).  You have signs of infection, such as vaginal  discharge that is: ? Different than usual. ? Yellow. ? Bad-smelling.  You have very pain or cramps in your lower belly that do not get better with medicine.  You feel light-headed.  You feel dizzy.  You pass out (faint). Summary  If you did not have a tissue sample removed (did not have a biopsy), you may only have some spotting for a few days. You can go back to your normal activities.  If you had a tissue sample removed, it is common to have mild pain and spotting for 48 hours.  For 3 days, or as long as your doctor tells you, avoid douching, using tampons and having sex.  Get help right away if you have bleeding, very bad pain, or signs of infection. This information is not intended to replace advice given to you by your health care provider. Make sure you discuss any questions you have with your health care provider. Document Revised: 08/07/2017 Document Reviewed: 05/14/2016 Elsevier Patient Education  2020 Elsevier Inc.   Human Papillomavirus Quadrivalent Vaccine suspension for injection What is this medicine? HUMAN PAPILLOMAVIRUS VACCINE (HYOO muhn pap uh LOH muh vahy ruhs vak SEEN) is a vaccine. It is used to prevent infections of four types of the human papillomavirus. In women, the vaccine may lower your risk of getting cervical, vaginal, vulvar, or anal cancer and genital warts. In men, the vaccine may lower your   risk of getting genital warts and anal cancer. You cannot get these diseases from the vaccine. This vaccine does not treat these diseases. This medicine may be used for other purposes; ask your health care provider or pharmacist if you have questions. COMMON BRAND NAME(S): Gardasil What should I tell my health care provider before I take this medicine? They need to know if you have any of these conditions:  fever or infection  hemophilia  HIV infection or AIDS  immune system problems  low platelet count  an unusual reaction to Human Papillomavirus  Vaccine, yeast, other medicines, foods, dyes, or preservatives  pregnant or trying to get pregnant  breast-feeding How should I use this medicine? This vaccine is for injection in a muscle on your upper arm or thigh. It is given by a health care professional. You will be observed for 15 minutes after each dose. Sometimes, fainting happens after the vaccine is given. You may be asked to sit or lie down during the 15 minutes. Three doses are given. The second dose is given 2 months after the first dose. The last dose is given 4 months after the second dose. A copy of a Vaccine Information Statement will be given before each vaccination. Read this sheet carefully each time. The sheet may change frequently. Talk to your pediatrician regarding the use of this medicine in children. While this drug may be prescribed for children as young as 9 years of age for selected conditions, precautions do apply. Overdosage: If you think you have taken too much of this medicine contact a poison control center or emergency room at once. NOTE: This medicine is only for you. Do not share this medicine with others. What if I miss a dose? All 3 doses of the vaccine should be given within 6 months. Remember to keep appointments for follow-up doses. Your health care provider will tell you when to return for the next vaccine. Ask your health care professional for advice if you are unable to keep an appointment or miss a scheduled dose. What may interact with this medicine?  other vaccines This list may not describe all possible interactions. Give your health care provider a list of all the medicines, herbs, non-prescription drugs, or dietary supplements you use. Also tell them if you smoke, drink alcohol, or use illegal drugs. Some items may interact with your medicine. What should I watch for while using this medicine? This vaccine may not fully protect everyone. Continue to have regular pelvic exams and cervical or anal  cancer screenings as directed by your doctor. The Human Papillomavirus is a sexually transmitted disease. It can be passed by any kind of sexual activity that involves genital contact. The vaccine works best when given before you have any contact with the virus. Many people who have the virus do not have any signs or symptoms. Tell your doctor or health care professional if you have any reaction or unusual symptom after getting the vaccine. What side effects may I notice from receiving this medicine? Side effects that you should report to your doctor or health care professional as soon as possible:  allergic reactions like skin rash, itching or hives, swelling of the face, lips, or tongue  breathing problems  feeling faint or lightheaded, falls Side effects that usually do not require medical attention (report to your doctor or health care professional if they continue or are bothersome):  cough  dizziness  fever  headache  nausea  redness, warmth, swelling, pain, or itching   at site where injected This list may not describe all possible side effects. Call your doctor for medical advice about side effects. You may report side effects to FDA at 1-800-FDA-1088. Where should I keep my medicine? This drug is given in a hospital or clinic and will not be stored at home. NOTE: This sheet is a summary. It may not cover all possible information. If you have questions about this medicine, talk to your doctor, pharmacist, or health care provider.  2020 Elsevier/Gold Standard (2013-10-17 13:14:33)  

## 2019-11-16 LAB — SURGICAL PATHOLOGY

## 2019-11-21 ENCOUNTER — Telehealth (INDEPENDENT_AMBULATORY_CARE_PROVIDER_SITE_OTHER): Payer: Self-pay | Admitting: General Practice

## 2019-11-21 DIAGNOSIS — Z9889 Other specified postprocedural states: Secondary | ICD-10-CM

## 2019-11-21 NOTE — Telephone Encounter (Signed)
-----   Message from Marlowe Alt, DO sent at 11/21/2019 10:38 AM EDT ----- Patient's colposcopy biopsy shows CIN I. She needs Pap with cytology in 1 year

## 2019-11-21 NOTE — Telephone Encounter (Signed)
Yes, absolutely.

## 2019-11-21 NOTE — Telephone Encounter (Signed)
Called patient and discussed she may come in for Gardasil vaccine series. Discussed someone from the front office will call her with an appt. Patient verbalized understanding & had no questions.

## 2019-11-21 NOTE — Telephone Encounter (Signed)
Called patient and informed her of colposcopy results and recommended follow up in 1 year. Patient verbalized understanding and asked about Gardasil vaccine series. Told her I would verify with Dr Salomon Mast and let her know but it likely wouldn't be a problem. Patient verbalized understanding.

## 2019-11-22 ENCOUNTER — Telehealth: Payer: Self-pay | Admitting: Obstetrics and Gynecology

## 2019-11-22 NOTE — Telephone Encounter (Signed)
Called the patient to inform of the upcoming appointment. The patient verbalized understanding. °

## 2019-12-01 ENCOUNTER — Other Ambulatory Visit: Payer: Self-pay

## 2019-12-01 ENCOUNTER — Ambulatory Visit (INDEPENDENT_AMBULATORY_CARE_PROVIDER_SITE_OTHER): Payer: Self-pay | Admitting: General Practice

## 2019-12-01 VITALS — BP 122/70 | HR 73 | Ht 64.0 in | Wt 149.0 lb

## 2019-12-01 DIAGNOSIS — Z23 Encounter for immunization: Secondary | ICD-10-CM

## 2019-12-01 NOTE — Progress Notes (Signed)
Alyssa Nelson here for Gardasil 9  Injection.  Injection administered without complication. Patient will return in 2 months for next injection.  Marylynn Pearson, RN 12/01/2019  10:58 AM

## 2019-12-03 NOTE — Progress Notes (Signed)
Chart reviewed for nurse visit. Agree with plan of care.   Shawntrice Salle I, NP 12/03/2019 2:28 PM  

## 2019-12-12 ENCOUNTER — Telehealth (INDEPENDENT_AMBULATORY_CARE_PROVIDER_SITE_OTHER): Payer: Self-pay

## 2019-12-12 DIAGNOSIS — Z712 Person consulting for explanation of examination or test findings: Secondary | ICD-10-CM

## 2019-12-12 NOTE — Telephone Encounter (Signed)
Pt called and requested appt on 12/14/19 be changed to virtual appointment. Visit is for colposcopy results: okay to change to virtual per Shawnie Pons, MD. Front office notified to change appt to virtual.

## 2019-12-14 ENCOUNTER — Telehealth (INDEPENDENT_AMBULATORY_CARE_PROVIDER_SITE_OTHER): Payer: Self-pay | Admitting: Family Medicine

## 2019-12-14 ENCOUNTER — Other Ambulatory Visit: Payer: Self-pay

## 2019-12-14 DIAGNOSIS — Z8742 Personal history of other diseases of the female genital tract: Secondary | ICD-10-CM

## 2019-12-14 DIAGNOSIS — Z5329 Procedure and treatment not carried out because of patient's decision for other reasons: Secondary | ICD-10-CM

## 2019-12-14 NOTE — Progress Notes (Signed)
No showed for visit, multiple calls to patient  Marlowe Alt, DO OB Fellow, [facility] 12/14/2019 4:33 PM

## 2019-12-14 NOTE — Progress Notes (Signed)
1532-- Called patient for appt check in, no answer- left message to call us back asap

## 2019-12-28 ENCOUNTER — Encounter: Payer: Self-pay | Admitting: *Deleted

## 2020-01-26 ENCOUNTER — Ambulatory Visit: Payer: Self-pay

## 2020-01-26 ENCOUNTER — Other Ambulatory Visit: Payer: Self-pay

## 2020-01-26 ENCOUNTER — Ambulatory Visit (INDEPENDENT_AMBULATORY_CARE_PROVIDER_SITE_OTHER): Payer: Self-pay | Admitting: General Practice

## 2020-01-26 ENCOUNTER — Encounter: Payer: Self-pay | Admitting: Family Medicine

## 2020-01-26 VITALS — BP 132/103 | HR 76 | Ht 64.0 in | Wt 146.0 lb

## 2020-01-26 DIAGNOSIS — Z23 Encounter for immunization: Secondary | ICD-10-CM

## 2020-01-26 NOTE — Progress Notes (Signed)
Patient seen and assessed by nursing staff.  Agree with documentation and plan.  

## 2020-01-26 NOTE — Progress Notes (Signed)
Basil Lever here for Gardasil 9  Injection.  Injection administered without complication. Patient will return in 4 months for next injection. BP is elevated today- patient reports forgetting to take BP medication. Encouraged patient to take medication when getting home.  Marylynn Pearson, RN 01/26/2020  4:04 PM

## 2020-05-30 ENCOUNTER — Ambulatory Visit (INDEPENDENT_AMBULATORY_CARE_PROVIDER_SITE_OTHER): Payer: Self-pay

## 2020-05-30 ENCOUNTER — Other Ambulatory Visit: Payer: Self-pay

## 2020-05-30 VITALS — BP 116/74 | HR 82 | Wt 164.1 lb

## 2020-05-30 DIAGNOSIS — Z23 Encounter for immunization: Secondary | ICD-10-CM

## 2020-05-30 NOTE — Progress Notes (Signed)
Patient was assessed and managed by nursing staff during this encounter. I have reviewed the chart and agree with the documentation and plan. I have also made any necessary editorial changes.  Thressa Sheller DNP, CNM  05/30/20  11:01 AM

## 2020-05-30 NOTE — Progress Notes (Signed)
Alyssa Nelson here for third Gardasil  Injection.  Injection administered without complication. Patient will follow up with office as needed.  Pt reports breast tenderness. Denies any visual changes. Denies any nipple discharge. Encouraged pt to follow up if tenderness continues or there are other changes. Pt inquires about COVID vaccine. Explained we do not offer that in our office. Encouraged pt to call the health department for vaccine appt.  Marjo Bicker, RN 05/30/2020  8:40 AM

## 2020-05-31 ENCOUNTER — Ambulatory Visit: Payer: Self-pay

## 2020-10-09 ENCOUNTER — Other Ambulatory Visit: Payer: Self-pay

## 2020-10-09 ENCOUNTER — Encounter (HOSPITAL_BASED_OUTPATIENT_CLINIC_OR_DEPARTMENT_OTHER): Payer: Self-pay | Admitting: *Deleted

## 2020-10-09 ENCOUNTER — Emergency Department (HOSPITAL_BASED_OUTPATIENT_CLINIC_OR_DEPARTMENT_OTHER)
Admission: EM | Admit: 2020-10-09 | Discharge: 2020-10-09 | Disposition: A | Discharge status: Home / Self Care | Payer: Self-pay | Attending: Emergency Medicine | Admitting: Emergency Medicine

## 2020-10-09 DIAGNOSIS — N760 Acute vaginitis: Secondary | ICD-10-CM | POA: Insufficient documentation

## 2020-10-09 DIAGNOSIS — B9689 Other specified bacterial agents as the cause of diseases classified elsewhere: Secondary | ICD-10-CM

## 2020-10-09 LAB — WET PREP, GENITAL
Sperm: NONE SEEN
Trich, Wet Prep: NONE SEEN
Yeast Wet Prep HPF POC: NONE SEEN

## 2020-10-09 LAB — URINALYSIS, ROUTINE W REFLEX MICROSCOPIC
Bilirubin Urine: NEGATIVE
Glucose, UA: NEGATIVE mg/dL
Hgb urine dipstick: NEGATIVE
Ketones, ur: NEGATIVE mg/dL
Leukocytes,Ua: NEGATIVE
Nitrite: NEGATIVE
Protein, ur: NEGATIVE mg/dL
Specific Gravity, Urine: 1.01 (ref 1.005–1.030)
pH: 7.5 (ref 5.0–8.0)

## 2020-10-09 LAB — PREGNANCY, URINE: Preg Test, Ur: NEGATIVE

## 2020-10-09 MED ORDER — METRONIDAZOLE 500 MG PO TABS
500.0000 mg | ORAL_TABLET | Freq: Once | ORAL | Status: AC
  Administered 2020-10-09: 500 mg via ORAL
  Filled 2020-10-09: qty 1

## 2020-10-09 MED ORDER — METRONIDAZOLE 500 MG PO TABS: 500.0000 mg | ORAL_TABLET | Freq: Two times a day (BID) | ORAL | 0 refills | Status: DC

## 2020-10-09 NOTE — ED Provider Notes (Signed)
[facility] EMERGENCY DEPARTMENT Provider Note   CSN: [id number] Arrival date & time: 10/09/20  1809     History Chief Complaint  Patient presents with  . Vaginal Discharge    Alyssa Nelson is a 35 y.o. female with no relevant past medical history presents the ED with complaints of vaginal discharge and vaginal pain.    On my examination, patient reports that over the course of the past 48 hours, she has experienced vaginal odor.  She states that she has been cleaning her vagina with soap.  She thinks that irritated her skin which is causing her vaginal pain.  She denies any recent sexual intercourse.  She is not particular concern for STI.  She also endorses lower crampy abdominal discomfort over the same period of vaginal discharge.  She denies any fevers, chills, chest pain, shortness of breath, nausea or vomiting, inability eat or drink, changes in bowel habits or pain with defecation, dysuria, or increased urinary frequency.  She also states that she has been having worsening reflux disease, but admits that she has not been taking her omeprazole medications.  She plans to follow-up with her primary care provider.  HPI     Past Medical History:  Diagnosis Date  . Abnormal Pap smear     Patient Active Problem List   Diagnosis Date Noted  . Screening breast examination 10/11/2019  . Low grade squamous intraepith lesion on cytologic smear cervix (lgsil) 10/11/2019    Past Surgical History:  Procedure Laterality Date  . WISDOM TOOTH EXTRACTION     age 54     OB History    Gravida  0   Para      Term      Preterm      AB      Living        SAB      IAB      Ectopic      Multiple      Live Births              No family history on file.  Social History   Tobacco Use  . Smoking status: Current Every Day Smoker  . Smokeless tobacco: Never Used  Substance Use Topics  . Alcohol use: Yes  . Drug use: No    Home Medications Prior to  Admission medications   Medication Sig Start Date End Date Taking? Authorizing Provider  metroNIDAZOLE (FLAGYL) 500 MG tablet Take 1 tablet (500 mg total) by mouth 2 (two) times daily. 10/09/20  Yes Lorelee New, PA-C  cetirizine (ZYRTEC) 10 MG tablet Take 10 mg by mouth daily.    [provider]  losartan (COZAAR) 25 MG tablet Take 25 mg by mouth daily.  06/23/19   [provider]  naproxen (NAPROSYN) 500 MG tablet Take 1 tablet (500 mg total) by mouth 2 (two) times daily as needed (for pain related to accident). Patient not taking: No sig reported 11/20/17   Dwana Melena, DO  omeprazole (PRILOSEC) 40 MG capsule Take 40 mg by mouth daily. 05/10/20   [provider]  pantoprazole (PROTONIX) 40 MG tablet Take by mouth. 05/27/19   [provider]  REMERON 15 MG tablet Take 15 mg by mouth at bedtime. 03/05/20   [provider]  traZODone (DESYREL) 100 MG tablet Take 50-100 mg by mouth at bedtime as needed. 05/10/20   [provider]    Allergies    Patient has no known allergies.  Review of Systems   Review of Systems  Physical Exam Updated Vital Signs BP (!) 120/98 (BP Location: Right Arm)   Pulse 90   Temp 98.5 F (36.9 C) (Oral)   Resp 20   Ht 5\' 5"  (1.651 m)   Wt 63.5 kg   LMP 10/02/2020   SpO2 100%   BMI 23.30 kg/m   Physical Exam Vitals and nursing note reviewed. Exam conducted with a chaperone present.  Constitutional:      General: She is not in acute distress.    Appearance: Normal appearance. She is not ill-appearing or toxic-appearing.  HENT:     Head: Normocephalic and atraumatic.  Eyes:     General: No scleral icterus.    Conjunctiva/sclera: Conjunctivae normal.  Pulmonary:     Effort: Pulmonary effort is normal.  Abdominal:     General: Abdomen is flat. There is no distension.     Palpations: Abdomen is soft.     Tenderness: There is no abdominal tenderness. There is no guarding.  Genitourinary:    Comments:  Mildly dry, mildly erythematous external vulva.  Whitish discharge noted around vaginal opening. Speculum exam: Significant amount of thin white vaginal discharge that is overlying a mildly erythematous base.  Cervix appears normal, nonfriable. Bimanual exam: No CMT.  No adnexal tenderness. Skin:    General: Skin is dry.  Neurological:     Mental Status: She is alert.     GCS: GCS eye subscore is 4. GCS verbal subscore is 5. GCS motor subscore is 6.  Psychiatric:        Mood and Affect: Mood normal.        Behavior: Behavior normal.        Thought Content: Thought content normal.     ED Results / Procedures / Treatments   Labs (all labs ordered are listed, but only abnormal results are displayed) Labs Reviewed  WET PREP, GENITAL - Abnormal; Notable for the following components:      Result Value   Clue Cells Wet Prep HPF POC PRESENT (*)    WBC, Wet Prep HPF POC MANY (*)    All other components within normal limits  URINALYSIS, ROUTINE W REFLEX MICROSCOPIC - Abnormal; Notable for the following components:   APPearance HAZY (*)    All other components within normal limits  PREGNANCY, URINE  GC/CHLAMYDIA PROBE AMP ([place]) NOT AT [facility]    EKG None  Radiology No results found.  Procedures Procedures   Medications Ordered in ED Medications  metroNIDAZOLE (FLAGYL) tablet 500 mg (has no administration in time range)    ED Course  I have reviewed the triage vital signs and the nursing notes.  Pertinent labs & imaging results that were available during my care of the patient were reviewed by me and considered in my medical decision making (see chart for details).    MDM Rules/Calculators/A&P                          Glendell Schlottman was evaluated in Emergency Department on 10/09/2020 for the symptoms described in the history of present illness. She was evaluated in the context of the global COVID-19 pandemic, which necessitated consideration that the patient might be at  risk for infection with the SARS-CoV-2 virus that causes COVID-19. Institutional protocols and algorithms that pertain to the evaluation of patients at risk for COVID-19 are in a state of rapid change based on information released by regulatory bodies  including the CDC and federal and state organizations. These policies and algorithms were followed during the patient's care in the ED.  I personally reviewed patient's medical chart and all notes from triage and staff during today's encounter. I have also ordered and reviewed all labs and imaging that I felt to be medically necessary in the evaluation of this patient's complaints and with consideration of their with their physical exam. If needed, translation services were available and utilized.   Patient with a curious history.  She initially tells me that she was having vaginal irritation from using soap on her vulva, suggestive of a mild dermatitis or dry skin.  However, she then tells me that she was plucking hairs from her vaginal area which she states is a nervous habit.  She states that she is in a committed relationship with a single female partner and uses protection.  She has very low suspicion for STI.  There were no ulcerative lesions concerning for genital herpes.  There was thin white discharge in vaginal vault and wet prep was notable for many WBC and present clue cells.  We will treat for bacterial vaginosis.  Bimanual exam was without CMT or adnexal tenderness.  I will hold off on treating her for STI given her low suspicion and lack of PID based on my physical exam.  Patient will follow-up with her PCP, Dr. Jerilynn Som, regarding today's encounter and seek treatment if her GC testing is positive.  She states that she should be able to schedule an appointment with him shortly.  All of the evaluation and work-up results were discussed with the patient and any family at bedside.  Patient and/or family were informed that while patient is appropriate for  discharge at this time, some medical emergencies may only develop or become detectable after a period of time.  I specifically instructed patient and/or family to return to return to the ED or seek immediate medical attention for any new or worsening symptoms.  They were provided opportunity to ask any additional questions and have none at this time.  Prior to discharge patient is feeling well, agreeable with plan for discharge home.  They have expressed understanding of verbal discharge instructions as well as return precautions and are agreeable to the plan.    Final Clinical Impression(s) / ED Diagnoses Final diagnoses:  BV (bacterial vaginosis)    Rx / DC Orders ED Discharge Orders         Ordered    metroNIDAZOLE (FLAGYL) 500 MG tablet  2 times daily        10/09/20 2303           Elvera Maria 10/09/20 2327    Terald Sleeper, MD 10/10/20 1027

## 2020-10-09 NOTE — ED Triage Notes (Signed)
C/o vaginal discharge, lower back pain and pelvic pain x 1 week

## 2020-10-09 NOTE — Discharge Instructions (Addendum)
Please follow-up with your primary care provider, Dr. Jerilynn Som, for ongoing evaluation and management.  You did NOT receive a pap smear and will still need to have that conducted at your next appointment with your primary care provider.    Please take your antibiotics, as described.    You are being treated for bacterial vaginosis.  Please take your antibiotic, as prescribed.  Take with food.  Do not drink alcohol with it.  You have been tested today for gonorrhea and chlamydia. These results will be available in approximately 3 days. You may check your MyChart account for results. Please inform all sexual partners of positive results and that they should be tested and treated as well.  Please wait 2 weeks and be sure that you and your partners are symptom free before returning to sexual activity. Please use protection with every sexual encounter.  Follow Up: Please followup with your primary doctor, Dr. Jerilynn Som, in the next few days.  Return to the ED or seek immediate medical attention should you experience any new or worsening symptoms.

## 2020-10-10 LAB — GC/CHLAMYDIA PROBE AMP (~~LOC~~) NOT AT ARMC
Chlamydia: NEGATIVE
Comment: NEGATIVE
Comment: NORMAL
Neisseria Gonorrhea: NEGATIVE

## 2020-10-11 ENCOUNTER — Emergency Department (HOSPITAL_BASED_OUTPATIENT_CLINIC_OR_DEPARTMENT_OTHER)
Admission: EM | Admit: 2020-10-11 | Discharge: 2020-10-11 | Disposition: A | Discharge status: Home / Self Care | Payer: Self-pay | Attending: Emergency Medicine | Admitting: Emergency Medicine

## 2020-10-11 ENCOUNTER — Other Ambulatory Visit: Payer: Self-pay

## 2020-10-11 ENCOUNTER — Encounter (HOSPITAL_BASED_OUTPATIENT_CLINIC_OR_DEPARTMENT_OTHER): Payer: Self-pay | Admitting: *Deleted

## 2020-10-11 DIAGNOSIS — Z5321 Procedure and treatment not carried out due to patient leaving prior to being seen by health care provider: Secondary | ICD-10-CM | POA: Insufficient documentation

## 2020-10-11 DIAGNOSIS — N94819 Vulvodynia, unspecified: Secondary | ICD-10-CM | POA: Insufficient documentation

## 2020-10-11 NOTE — ED Triage Notes (Addendum)
Patient is here with vaginal burning. She was seen for vaginal discharge 2 days ago and had a pelvic exam. States she wants a "shot". Asking how long she will be here.

## 2020-10-11 NOTE — ED Notes (Signed)
Was called to waiting room to talk to pt about wait time,  Pt was told unable to give wait time but we would get her back ASAP,  Pt started cursing stating what she was here 2 days ago and she should go on back,  She also wanted to see the Md she saw then, she yelled we gave here the wrong F---- medicine,  She left through the front doors yelling obscenities

## 2020-12-24 ENCOUNTER — Ambulatory Visit: Payer: Self-pay | Admitting: Student

## 2021-01-23 ENCOUNTER — Telehealth: Payer: Self-pay

## 2021-01-23 NOTE — Telephone Encounter (Signed)
Telephoned patient at home number. Left a voice message with [facility] contact information. 

## 2021-01-24 ENCOUNTER — Ambulatory Visit: Payer: Self-pay | Admitting: Student

## 2021-05-08 ENCOUNTER — Other Ambulatory Visit: Payer: Self-pay

## 2021-05-30 ENCOUNTER — Other Ambulatory Visit: Payer: Self-pay | Admitting: *Deleted

## 2021-05-30 ENCOUNTER — Other Ambulatory Visit: Payer: Self-pay

## 2021-05-30 DIAGNOSIS — Z124 Encounter for screening for malignant neoplasm of cervix: Secondary | ICD-10-CM

## 2021-05-30 NOTE — Progress Notes (Signed)
Patient: Alyssa Nelson           Date of Birth: 1986-04-09           MRN: [id number] Visit Date: 05/30/2021 PCP: Patient, No Pcp Per (Inactive)  Cervical Cancer Screening Do you smoke?: No Have you ever had or been told you have an allergy to latex products?: No Marital status: Single Date of last pap smear: 1-2 yrs ago (06/08/2020 [facility] ([facility] Date of last menstrual period: 05/20/21 Number of pregnancies: 0 Number of births: 0 Have you ever had any of the following? Hysterectomy: No Tubal ligation (tubes tied): No Abnormal bleeding: No Abnormal pap smear: No Venereal warts: No A sex partner with venereal warts: No A high risk* sex partner: No  Cervical Exam  Abnormal Observations: Normal Exam. Recommendations: Last Pap smear was 06/09/2019 at the [facility] and LS[facility]. Patient had a colposcopy completed 11/14/2019 for follow-up that showed CIN-I. Per patient has a history of one other abnormal Pap smear around 2010 or 2011 that a repeat Pap smear was completed for follow-up that was normal. Last Pap smear result is in Epic. Let patient know that if today's Pap smear is normal that her next Pap smear will be due in one. Informed patient that will follow-up with her within the next couple of weeks with results of her Pap smear by phone.     Patient's History Patient Active Problem List   Diagnosis Date Noted   Screening breast examination 10/11/2019   Low grade squamous intraepith lesion on cytologic smear cervix (lgsil) 10/11/2019   Past Medical History:  Diagnosis Date   Abnormal Pap smear     No family history on file.  Social History   Occupational History   Not on file  Tobacco Use   Smoking status: Every Day   Smokeless tobacco: Never  Substance and Sexual Activity   Alcohol use: Yes   Drug use: No   Sexual activity: Yes    Birth control/protection: None

## 2021-06-03 LAB — CYTOLOGY - PAP
Comment: NEGATIVE
High risk HPV: POSITIVE — AB

## 2021-06-12 ENCOUNTER — Telehealth: Payer: Self-pay

## 2021-06-12 NOTE — Telephone Encounter (Signed)
Patient informed pap results, LSIL with positive HPV, needs colposcopy. Discussed with patient. Patient informed will need to be seen in our [facility] clinic and will have colposcopy at [facility] ([address] will call with appointment information. Patient verbalized understanding. Referral information sent to [facility].

## 2021-06-20 ENCOUNTER — Telehealth: Payer: Self-pay

## 2021-06-20 NOTE — Telephone Encounter (Signed)
Attempted to contact patient regarding scheduling a [facility] appointment. Left message on voicemail requesting return call.

## 2021-07-24 ENCOUNTER — Telehealth: Payer: Self-pay | Admitting: Family Medicine

## 2021-07-24 NOTE — Telephone Encounter (Signed)
Call placed to pt. No answer. Left VM to return call.  Alyssa Nelson

## 2021-07-24 NOTE — Telephone Encounter (Signed)
Patient called and said she has been bleeding for 2 months and has been trying to get in contact with a nurse about it because she fears she may bleed to death. She is wanting immediate help and not sure what to do.

## 2021-07-24 NOTE — Telephone Encounter (Signed)
Pt returned call to office. Pt states having ongoing vaginal bleeding x 2 months. Pt states changing pads 5 times a day to be clean. Pt denies soaking pads, pain or cramps. Only have small quarter size or smaller clots, but also having "drips of blood in the toilet" after urination. Pt states experiencing dizziness, being tired and lightheaded since having the bleeding. Pt states is SA and having unprotected IC. Has not taken UPT at home. Pt not currently on BCM. Has missed work a couple of days due to tiredness and feeling dizzy.  Pt had last regular LMP in Sept.   Reviewed call with Dr Alvester Morin. Pt advised to come in for nurse visit for UPT and possible beta hcg if positive in office. Pt agreeable to plan of care. Pt cannot come today due to being at work in [place]. Pt scheduled for 11/17 at 830am. Pt agreeable to plan of care.   Laney Pastor

## 2021-07-25 ENCOUNTER — Other Ambulatory Visit (HOSPITAL_COMMUNITY)
Admission: RE | Admit: 2021-07-25 | Discharge: 2021-07-25 | Disposition: A | Discharge status: Home / Self Care | Payer: Self-pay | Source: Ambulatory Visit | Attending: Family Medicine | Admitting: Family Medicine

## 2021-07-25 ENCOUNTER — Ambulatory Visit (INDEPENDENT_AMBULATORY_CARE_PROVIDER_SITE_OTHER): Payer: Self-pay

## 2021-07-25 ENCOUNTER — Other Ambulatory Visit: Payer: Self-pay

## 2021-07-25 VITALS — BP 137/107 | HR 76 | Ht 64.0 in | Wt 132.5 lb

## 2021-07-25 DIAGNOSIS — R42 Dizziness and giddiness: Secondary | ICD-10-CM

## 2021-07-25 DIAGNOSIS — Z3202 Encounter for pregnancy test, result negative: Secondary | ICD-10-CM | POA: Insufficient documentation

## 2021-07-25 LAB — CBC
Hematocrit: 45.4 % (ref 34.0–46.6)
Hemoglobin: 15 g/dL (ref 11.1–15.9)
MCH: 31.3 pg (ref 26.6–33.0)
MCHC: 33 g/dL (ref 31.5–35.7)
MCV: 95 fL (ref 79–97)
Platelets: 335 10*3/uL (ref 150–450)
RBC: 4.79 x10E6/uL (ref 3.77–5.28)
RDW: 11.8 % (ref 11.7–15.4)
WBC: 10.1 10*3/uL (ref 3.4–10.8)

## 2021-07-25 LAB — POCT PREGNANCY, URINE: Preg Test, Ur: NEGATIVE

## 2021-07-25 MED ORDER — NORGESTIMATE-ETH ESTRADIOL 0.25-35 MG-MCG PO TABS: 1.0000 | ORAL_TABLET | Freq: Every day | ORAL | 0 refills | Status: AC

## 2021-07-25 NOTE — Progress Notes (Addendum)
Pt here today for UPT due to vaginal bleeding for 2 months and last normal UPT in Sept.  UPT today was negative.   Spoke with Camelia Eng, NP. Pt advised to have self swab to check for infection and to start Rx Sprintec x 1 pack to stop bleeding and regulate hormones. Pt given recommendations and agreeable to plan of care. Rx sent to pharmacy on file. Pt desires CBC to check for anemia. No hx of anemia noted. Self swab collected today. Pt advised results will take 24-48 hours and will see results in mychart and will be notified if needs further treatment. Pt verbalized understanding.   Pt has next appt on 12/15 for colpo with Dr Alvester Morin and will give Dr Alvester Morin update on bleeding at this appt.   Sybrina Laning,RN    Chart reviewed for nurse visit. Agree with plan of care.   Currie Paris, NP 07/25/2021 10:58 AM

## 2021-07-29 LAB — CERVICOVAGINAL ANCILLARY ONLY
Bacterial Vaginitis (gardnerella): POSITIVE — AB
Candida Glabrata: NEGATIVE
Candida Vaginitis: NEGATIVE
Comment: NEGATIVE
Comment: NEGATIVE
Comment: NEGATIVE
Comment: NEGATIVE
Trichomonas: NEGATIVE

## 2021-07-30 ENCOUNTER — Other Ambulatory Visit: Payer: Self-pay | Admitting: Nurse Practitioner

## 2021-07-30 MED ORDER — METRONIDAZOLE 500 MG PO TABS: 500.0000 mg | ORAL_TABLET | Freq: Two times a day (BID) | ORAL | 0 refills | Status: AC

## 2021-08-22 ENCOUNTER — Ambulatory Visit: Payer: Self-pay

## 2021-08-22 ENCOUNTER — Ambulatory Visit: Payer: Self-pay | Admitting: Family Medicine

## 2021-09-18 ENCOUNTER — Other Ambulatory Visit: Payer: Self-pay | Admitting: Nurse Practitioner

## 2023-02-05 ENCOUNTER — Other Ambulatory Visit: Payer: Self-pay

## 2023-02-05 ENCOUNTER — Other Ambulatory Visit (HOSPITAL_BASED_OUTPATIENT_CLINIC_OR_DEPARTMENT_OTHER): Payer: Self-pay

## 2023-02-05 ENCOUNTER — Encounter (HOSPITAL_BASED_OUTPATIENT_CLINIC_OR_DEPARTMENT_OTHER): Payer: Self-pay

## 2023-02-05 ENCOUNTER — Emergency Department (HOSPITAL_BASED_OUTPATIENT_CLINIC_OR_DEPARTMENT_OTHER): Payer: Medicaid Other

## 2023-02-05 ENCOUNTER — Emergency Department (HOSPITAL_BASED_OUTPATIENT_CLINIC_OR_DEPARTMENT_OTHER)
Admission: EM | Admit: 2023-02-05 | Discharge: 2023-02-05 | Disposition: A | Discharge status: Home / Self Care | Payer: Medicaid Other | Attending: Emergency Medicine | Admitting: Emergency Medicine

## 2023-02-05 DIAGNOSIS — N3001 Acute cystitis with hematuria: Secondary | ICD-10-CM | POA: Insufficient documentation

## 2023-02-05 DIAGNOSIS — N12 Tubulo-interstitial nephritis, not specified as acute or chronic: Secondary | ICD-10-CM | POA: Insufficient documentation

## 2023-02-05 DIAGNOSIS — R1032 Left lower quadrant pain: Secondary | ICD-10-CM | POA: Diagnosis present

## 2023-02-05 LAB — CBC WITH DIFFERENTIAL/PLATELET
Abs Immature Granulocytes: 0.11 10*3/uL — ABNORMAL HIGH (ref 0.00–0.07)
Basophils Absolute: 0 10*3/uL (ref 0.0–0.1)
Basophils Relative: 0 %
Eosinophils Absolute: 0.1 10*3/uL (ref 0.0–0.5)
Eosinophils Relative: 1 %
HCT: 39.4 % (ref 36.0–46.0)
Hemoglobin: 13.2 g/dL (ref 12.0–15.0)
Immature Granulocytes: 1 %
Lymphocytes Relative: 17 %
Lymphs Abs: 2 10*3/uL (ref 0.7–4.0)
MCH: 32 pg (ref 26.0–34.0)
MCHC: 33.5 g/dL (ref 30.0–36.0)
MCV: 95.4 fL (ref 80.0–100.0)
Monocytes Absolute: 1.5 10*3/uL — ABNORMAL HIGH (ref 0.1–1.0)
Monocytes Relative: 13 %
Neutro Abs: 8.1 10*3/uL — ABNORMAL HIGH (ref 1.7–7.7)
Neutrophils Relative %: 68 %
Platelets: 252 10*3/uL (ref 150–400)
RBC: 4.13 MIL/uL (ref 3.87–5.11)
RDW: 14.1 % (ref 11.5–15.5)
WBC: 11.9 10*3/uL — ABNORMAL HIGH (ref 4.0–10.5)
nRBC: 0 % (ref 0.0–0.2)

## 2023-02-05 LAB — COMPREHENSIVE METABOLIC PANEL
ALT: 12 U/L (ref 0–44)
AST: 17 U/L (ref 15–41)
Albumin: 4.1 g/dL (ref 3.5–5.0)
Alkaline Phosphatase: 59 U/L (ref 38–126)
Anion gap: 7 (ref 5–15)
BUN: 9 mg/dL (ref 6–20)
CO2: 25 mmol/L (ref 22–32)
Calcium: 9.5 mg/dL (ref 8.9–10.3)
Chloride: 106 mmol/L (ref 98–111)
Creatinine, Ser: 0.92 mg/dL (ref 0.44–1.00)
GFR, Estimated: >60 mL/min (ref >60)
Glucose, Bld: 114 mg/dL — ABNORMAL HIGH (ref 70–99)
Potassium: 3.6 mmol/L (ref 3.5–5.1)
Sodium: 138 mmol/L (ref 135–145)
Total Bilirubin: 0.5 mg/dL (ref 0.3–1.2)
Total Protein: 7.2 g/dL (ref 6.5–8.1)

## 2023-02-05 LAB — URINALYSIS, MICROSCOPIC (REFLEX)

## 2023-02-05 LAB — URINALYSIS, ROUTINE W REFLEX MICROSCOPIC
Bilirubin Urine: NEGATIVE
Glucose, UA: NEGATIVE mg/dL
Ketones, ur: NEGATIVE mg/dL
Nitrite: POSITIVE — AB
Protein, ur: NEGATIVE mg/dL
Specific Gravity, Urine: 1.02 (ref 1.005–1.030)
pH: 7 (ref 5.0–8.0)

## 2023-02-05 LAB — LIPASE, BLOOD: Lipase: 33 U/L (ref 11–51)

## 2023-02-05 LAB — PREGNANCY, URINE: Preg Test, Ur: NEGATIVE

## 2023-02-05 MED ORDER — CEPHALEXIN 250 MG PO CAPS
500.0000 mg | ORAL_CAPSULE | Freq: Once | ORAL | Status: AC
  Administered 2023-02-05: 500 mg via ORAL
  Filled 2023-02-05: qty 2

## 2023-02-05 MED ORDER — CEPHALEXIN 500 MG PO CAPS
500.0000 mg | ORAL_CAPSULE | Freq: Four times a day (QID) | ORAL | 0 refills | Status: AC
  Filled 2023-02-05: qty 40, 10d supply, fill #0

## 2023-02-05 MED ORDER — OXYCODONE-ACETAMINOPHEN 5-325 MG PO TABS
1.0000 | ORAL_TABLET | Freq: Four times a day (QID) | ORAL | 0 refills | Status: AC | PRN
  Filled 2023-02-05: qty 9, 3d supply, fill #0

## 2023-02-05 MED ORDER — IOHEXOL 300 MG/ML  SOLN
100.0000 mL | Freq: Once | INTRAMUSCULAR | Status: AC | PRN
  Administered 2023-02-05: 100 mL via INTRAVENOUS

## 2023-02-05 MED ORDER — KETOROLAC TROMETHAMINE 15 MG/ML IJ SOLN
15.0000 mg | Freq: Once | INTRAMUSCULAR | Status: AC
  Administered 2023-02-05: 15 mg via INTRAVENOUS
  Filled 2023-02-05: qty 1

## 2023-02-05 NOTE — ED Notes (Signed)
Urine specimen in lab 

## 2023-02-05 NOTE — ED Notes (Signed)
Patient to nurses station requesting pain medication. Asking what is taking so long. Message sent to provider requesting medication.  Patient then noted to be walking out of department with belongings. Stopped by this RN and Tammy Sours, tech to remove IV as patient was leaving. Patient yelling at staff demanding pain medications and asking where the doctor is and why she hasn't gotten pain meds. Patient informed that the provider is in another room, and that she was messaged to request pain medications and that nothing had been ordered yet. Patient continuing to yell at staff, stating "you shouldve gave me pain meds a while ago, what's going on". Patient informed that if she is trying to leave we will need to remove IV, patient states she wants pain meds and decides to stay. Informed to return to room and we will have provider come in. Police officer now speaking with patient as she continues to raise voice in hallway. Patient directed back to room, provider at bedside.

## 2023-02-05 NOTE — ED Provider Notes (Signed)
EMERGENCY DEPARTMENT AT [facility] Provider Note   CSN: [id number] Arrival date & time: 02/05/23  0900     History  Chief Complaint  Patient presents with   Abdominal Pain    Alyssa Nelson is a 37 y.o. female who presents to the ED with concerns for left flank pain onset 2 weeks. Notes that her left flank pain radiates into the groin.  Has associated generalized bodyaches, subjective fever, chills.  No sick contacts with similar symptoms.  Took Tylenol at home with some improvement of her symptoms. Denies urinary symptoms, vaginal bleeding, vaginal discharge, nausea, vomiting.  Denies past medical history of UTI or kidney stones.  Notes past medical history of hypertension however has not been taking her medications that she is supposed to over the course of the past 2 weeks.  The history is provided by the patient. No language interpreter was used.       Home Medications Prior to Admission medications   Medication Sig Start Date End Date Taking? Authorizing Provider  cephALEXin (KEFLEX) 500 MG capsule Take 1 capsule (500 mg total) by mouth 4 (four) times daily for 10 days. 02/05/23 02/15/23 Yes Doneta Bayman A, PA-C  oxyCODONE-acetaminophen (PERCOCET/ROXICET) 5-325 MG tablet Take 1 tablet by mouth every 6 (six) hours as needed for severe pain. 02/05/23  Yes Dyna Figuereo A, PA-C  cetirizine (ZYRTEC) 10 MG tablet Take 10 mg by mouth daily.    [provider]  D2000 ULTRA STRENGTH 50 MCG (2000 UT) CAPS Take 1 capsule by mouth daily. 09/17/20   [provider]  fluticasone (FLONASE) 50 MCG/ACT nasal spray Place 2 sprays into both nostrils daily. 06/19/21   [provider]  gabapentin (NEURONTIN) 100 MG capsule Take by mouth. 07/17/21 08/16/21  [provider]  losartan (COZAAR) 25 MG tablet Take 25 mg by mouth daily.  06/23/19   [provider]  metroNIDAZOLE (FLAGYL) 500 MG tablet Take 1 tablet (500 mg total) by mouth 2 (two)  times daily. No alcohol while taking this medication 07/30/21   Currie Paris, NP  norgestimate-ethinyl estradiol (ORTHO-CYCLEN) 0.25-35 MG-MCG tablet Take 1 tablet by mouth daily. 07/25/21   Burleson, Brand Males, NP  omeprazole (PRILOSEC) 40 MG capsule Take 40 mg by mouth daily. 05/10/20   [provider]  Polysacch Fe Cmp-Fe Heme Poly (BIFERA) 28 MG TABS Take by mouth.    [provider]  Prenatal Vit-Fe Fumarate-FA (PRENATAL ONE DAILY) 27-0.8 MG TABS Take 1 tablet by mouth daily. 09/10/20   [provider]  REMERON 15 MG tablet Take 15 mg by mouth at bedtime. 03/05/20   [provider]  traZODone (DESYREL) 50 MG tablet Take 25-50 mg by mouth at bedtime as needed. 09/10/20   [provider]      Allergies    Patient has no known allergies.    Review of Systems   Review of Systems  All other systems reviewed and are negative.   Physical Exam Updated Vital Signs BP (!) 143/96   Pulse 74   Temp 98.6 F (37 C)   Resp 16   Ht 5\' 4"  (1.626 m)   Wt 61.2 kg   SpO2 100%   BMI 23.17 kg/m  Physical Exam Vitals and nursing note reviewed.  Constitutional:      General: She is not in acute distress.    Appearance: She is not diaphoretic.  HENT:     Head: Normocephalic and atraumatic.  Mouth/Throat:     Pharynx: No oropharyngeal exudate.  Eyes:     General: No scleral icterus.    Conjunctiva/sclera: Conjunctivae normal.  Cardiovascular:     Rate and Rhythm: Normal rate and regular rhythm.     Pulses: Normal pulses.     Heart sounds: Normal heart sounds.  Pulmonary:     Effort: Pulmonary effort is normal. No respiratory distress.     Breath sounds: Normal breath sounds. No wheezing.  Abdominal:     General: Bowel sounds are normal.     Palpations: Abdomen is soft. There is no mass.     Tenderness: There is no abdominal tenderness. There is right CVA tenderness and left CVA tenderness. There is no guarding or rebound.     Comments: No  appreciable abdominal tenderness to palpation.  Bilateral CVA tenderness to palpation, right > left.  Musculoskeletal:        General: Normal range of motion.     Cervical back: Normal range of motion and neck supple.  Skin:    General: Skin is warm and dry.  Neurological:     Mental Status: She is alert.  Psychiatric:        Behavior: Behavior normal.     ED Results / Procedures / Treatments   Labs (all labs ordered are listed, but only abnormal results are displayed) Labs Reviewed  URINALYSIS, ROUTINE W REFLEX MICROSCOPIC - Abnormal; Notable for the following components:      Result Value   APPearance HAZY (*)    Hgb urine dipstick SMALL (*)    Nitrite POSITIVE (*)    Leukocytes,Ua SMALL (*)    All other components within normal limits  URINALYSIS, MICROSCOPIC (REFLEX) - Abnormal; Notable for the following components:   Bacteria, UA MANY (*)    All other components within normal limits  CBC WITH DIFFERENTIAL/PLATELET - Abnormal; Notable for the following components:   WBC 11.9 (*)    Neutro Abs 8.1 (*)    Monocytes Absolute 1.5 (*)    Abs Immature Granulocytes 0.11 (*)    All other components within normal limits  COMPREHENSIVE METABOLIC PANEL - Abnormal; Notable for the following components:   Glucose, Bld 114 (*)    All other components within normal limits  URINE CULTURE  PREGNANCY, URINE  LIPASE, BLOOD    EKG None  Radiology CT ABDOMEN PELVIS W CONTRAST  Result Date: 02/05/2023 CLINICAL DATA:  Left flank pain radiating to the left groin with chills and body aches. EXAM: CT ABDOMEN AND PELVIS WITH CONTRAST TECHNIQUE: Multidetector CT imaging of the abdomen and pelvis was performed using the standard protocol following bolus administration of intravenous contrast. RADIATION DOSE REDUCTION: This exam was performed according to the departmental dose-optimization program which includes automated exposure control, adjustment of the mA and/or kV according to patient size  and/or use of iterative reconstruction technique. CONTRAST:  OMNIPAQUE IOHEXOL 300 MG/ML  SOLN COMPARISON:  CT 11/19/2017 FINDINGS: Lower chest: Mild linear opacity in the right lung base likely scar or atelectasis. No pleural effusion. Hepatobiliary: Focal fat deposition seen in the liver adjacent to the falciform ligament. There is tiny low-attenuation lesion seen in the right hepatic lobe on series 2, image 24, too small to completely characterize although likely a benign cystic lesion and no specific imaging follow-up. The portal vein is patent. Gallbladder is nondilated. Pancreas: Unremarkable. No pancreatic ductal dilatation or surrounding inflammatory changes. Spleen: Normal in size without focal abnormality. Adrenals/Urinary Tract: The adrenal glands are  preserved. Slightly malrotated right kidney. No enhancing renal mass or collecting system dilatation. Subtle area of low-density along cortex of the kidney focally towards the hilum on series 5, image 34, axial series 2 image 25 which is new from previous and nonspecific. This area measures only 8 mm. The ureters have normal course and caliber down to the bladder. Preserved contours of the urinary bladder. No ureteral stones. Increasing phleboliths in the pelvis. Stomach/Bowel: There is luminal fluid and debris in the nondilated stomach. On this non oral contrast exam the stomach and large bowel are nondilated. Normal appendix is posterior to the cecum in the right lower quadrant. Vascular/Lymphatic: Aortic atherosclerosis. No enlarged abdominal or pelvic lymph nodes. Reproductive: Uterus and bilateral adnexa are unremarkable. Other: Trace free fluid. Musculoskeletal: No acute or significant osseous findings. Smaller area of soft tissue thickening along the right side of the perineum on axial image 72. IMPRESSION: No bowel obstruction, free air. Normal appendix. Trace nonspecific free fluid in the pelvis. Subtle low-density area along the medial aspect  of the left kidney which is new from previous measuring only 8 mm near the hilum of uncertain etiology and significance. Would recommend follow up imaging in 6 months. Small area of soft tissue thickening along the subcutaneous fat along the right side of the perineum at the level of the pubic symphysis on image 72. Please correlate with any clinical findings. Electronically Signed   By: Karen Kays M.D.   On: 02/05/2023 11:21    Procedures Procedures   Medications Ordered in ED Medications  iohexol (OMNIPAQUE) 300 MG/ML solution 100 mL (100 mLs Intravenous Contrast Given 02/05/23 1057)  ketorolac (TORADOL) 15 MG/ML injection 15 mg (15 mg Intravenous Given 02/05/23 1235)  cephALEXin (KEFLEX) capsule 500 mg (500 mg Oral Given 02/05/23 1235)    ED Course/ Medical Decision Making/ A&P Clinical Course as of 02/05/23 1341  Thu Feb 05, 2023  0957 WBC(!): 11.9 [SB]  1223 Patient noted to be in the hallway upset.  Redirected patient to room.  Pt-evaluated and discussed with patient lab and imaging findings.  Patient notes that she would like a dose of medication here prior to discharge.  Discussed with patient discharge treatment plan.  Answered all available questions.  Patient appears safe for discharge at this time. [SB]    Clinical Course User Index [SB] Kristalyn Bergstresser A, PA-C                             Medical Decision Making Amount and/or Complexity of Data Reviewed Labs: ordered. Decision-making details documented in ED Course. Radiology: ordered.  Risk Prescription drug management.    Patient presents to the emergency department with left flank pain x 2 weeks.  Has not been evaluated for symptoms. On exam patient with no appreciable abdominal tenderness to palpation.  Bilateral CVA tenderness to palpation, right > left.  Remainder of exam without acute findings.  Pt afebrile. Differential diagnosis includes cholecystitis, diverticulitis, pyelonephritis, nephrolithiasis, acute  cystitis.  Labs:  I ordered, and personally interpreted labs.  The pertinent results include:  Lipase negative CBC with leukocytosis at 11.9 otherwise unremarkable CMP unremarkable Pregnancy urine negative Urine cultures ordered with results pending Urinalysis nitrite, leukocyte, and bacteria positive  Imaging: I ordered imaging studies including CT abdomen pelvis with I independently visualized and interpreted imaging which showed  No bowel obstruction, free air. Normal appendix. Trace nonspecific  free fluid in the pelvis.    Subtle  low-density area along the medial aspect of the left kidney  which is new from previous measuring only 8 mm near the hilum of  uncertain etiology and significance. Would recommend follow up  imaging in 6 months.    Small area of soft tissue thickening along the subcutaneous fat  along the right side of the perineum at the level of the pubic  symphysis on image 72. Please correlate with any clinical findings.   I agree with the radiologist interpretation  Medications:  I ordered medication including Toradol, Keflex for symptom management.  I have reviewed the patients home medicines and have made adjustments as needed  Disposition: Presenting suspicious for likely acute cystitis as well as pyelonephritis.  Doubt concerns at this time for nephrolithiasis, diverticulitis, pancreatitis, cholecystitis. After consideration of the diagnostic results and the patients response to treatment, I feel that the patient would benefit from Discharge home.  Patient provided with work note.  PDMP reviewed, short course of Percocet sent to patient's pharmacy.  Keflex sent to patient's pharmacy.  Urine culture ordered with results pending at time of discharge.  Supportive care measures and strict return precautions discussed with patient at bedside. Pt acknowledges and verbalizes understanding. Pt appears safe for discharge. Follow up as indicated in discharge paperwork.    This chart was dictated using voice recognition software, Dragon. Despite the best efforts of this provider to proofread and correct errors, errors may still occur which can change documentation meaning.  Final Clinical Impression(s) / ED Diagnoses Final diagnoses:  Acute cystitis with hematuria  Pyelonephritis    Rx / DC Orders ED Discharge Orders          Ordered    cephALEXin (KEFLEX) 500 MG capsule  4 times daily        02/05/23 1228    oxyCODONE-acetaminophen (PERCOCET/ROXICET) 5-325 MG tablet  Every 6 hours PRN        02/05/23 1230              Darnel Mchan A, PA-C 02/05/23 1342    Linwood Dibbles, MD 02/05/23 1600

## 2023-02-05 NOTE — Discharge Instructions (Addendum)
It was a pleasure taking care of you today!  Your urine showed concerns for UTI at this time.  You will be sent for culture.  You will be sent a prescription for Keflex, take as directed.  Your CT scan did not show any acute emergent findings at this time.  Additional incidental finding on the left kidney that we will need to be followed with repeat imaging in 6 months.  You will also be sent a short course of Percocet to take as needed for breakthrough pain.  At home you may take 600 mg ibuprofen as needed for your symptoms.  You may follow-up with your primary care provider regarding today's ED visit.  If you do not have a primary care provider you may follow-up with [facility] health department or [facility] health community health and wellness.  Ensure to maintain fluid intake with water, tea, broth, soup, Gatorade, Pedialyte.  Return to the emergency department if you experience increasing/worsening symptoms.

## 2023-02-05 NOTE — ED Triage Notes (Signed)
Pt states left flank pain wrapping around into left groin, body aches, subjective fever, chills, took tylenol yesterday with some improvement.  Denies pain with urination, denies vaginal discharge.   Hx HTN

## 2023-02-06 LAB — URINE CULTURE

## 2023-02-07 LAB — URINE CULTURE: Culture: >=100000 — AB

## 2023-02-08 ENCOUNTER — Telehealth (HOSPITAL_BASED_OUTPATIENT_CLINIC_OR_DEPARTMENT_OTHER): Payer: Self-pay | Admitting: *Deleted

## 2023-02-08 NOTE — Telephone Encounter (Signed)
Post ED Visit - Positive Culture Follow-up: Unsuccessful Patient Follow-up  Culture assessed and recommendations reviewed by:  [x]  Penni Bombard, Pharm.D. []  Celedonio Miyamoto, [address].D., BCPS AQ-ID []  Garvin Fila, Pharm.D., BCPS []  Georgina Pillion, Pharm.D., BCPS []  [place], [address].D., BCPS, AAHIVP []  Estella Husk, Pharm.D., BCPS, AAHIVP []  Sherlynn Carbon, PharmD []  Pollyann Samples, PharmD, BCPS  Positive urine culture  []  Patient discharged without antimicrobial prescription and treatment is now indicated [x]  Organism is resistant to prescribed ED discharge antimicrobial []  Patient with positive blood cultures  Stop Keflex Start Ciprofloxacin 500mg  Q12H x 7 days (Qty 14, 0 Refills) per Tanda Rockers, MD  Unable to contact patient, letter will be sent to address on file  Alyssa Nelson 02/08/2023, 3:16 PM

## 2023-02-08 NOTE — Progress Notes (Signed)
ED Antimicrobial Stewardship Positive Culture Follow Up   Alyssa Nelson is an 37 y.o. female who presented to [facility] on 02/05/2023 with a chief complaint of  Chief Complaint  Patient presents with   Abdominal Pain    Recent Results (from the past 720 hour(s))  Urine Culture     Status: Abnormal   Collection Time: 02/05/23  8:35 AM   Specimen: Urine, Clean Catch  Result Value Ref Range Status   Specimen Description   Final    URINE, CLEAN CATCH Performed at [facility], [address] [place], [place] [address]    Special Requests   Final    NONE Performed at [facility], [address] [place], [place] [address]    Culture (A)  Final    >=100,000 COLONIES/mL ESCHERICHIA COLI Confirmed Extended Spectrum Beta-Lactamase Producer (ESBL).  In bloodstream infections from ESBL organisms, carbapenems are preferred over piperacillin/tazobactam. They are shown to have a lower risk of mortality.    Report Status 02/07/2023 FINAL  Final   Organism ID, Bacteria ESCHERICHIA COLI (A)  Final      Susceptibility   Escherichia coli - MIC*    AMPICILLIN >=32 RESISTANT Resistant     CEFAZOLIN >=64 RESISTANT Resistant     CEFEPIME 0.25 SENSITIVE Sensitive     CEFTRIAXONE 32 RESISTANT Resistant     CIPROFLOXACIN <=0.25 SENSITIVE Sensitive     GENTAMICIN <=1 SENSITIVE Sensitive     IMIPENEM <=0.25 SENSITIVE Sensitive     NITROFURANTOIN <=16 SENSITIVE Sensitive     TRIMETH/SULFA >=320 RESISTANT Resistant     AMPICILLIN/SULBACTAM >=32 RESISTANT Resistant     PIP/TAZO >=128 RESISTANT Resistant     * >=100,000 COLONIES/mL ESCHERICHIA COLI    [x]  Treated with Keflex, organism resistant to prescribed antimicrobial  STOP Keflex START: Ciprofloxacin 500mg  every 12 hours by mouth for 7 days (Qty 14; Refills 0)  ED Provider: Valrie Hart, PA; Tanda Rockers MD   Delmar Landau, PharmD, BCPS 02/08/2023 9:54 AM ED Clinical Pharmacist -  [phone]

## 2023-04-24 ENCOUNTER — Emergency Department (HOSPITAL_BASED_OUTPATIENT_CLINIC_OR_DEPARTMENT_OTHER): Payer: MEDICAID

## 2023-04-24 ENCOUNTER — Encounter (HOSPITAL_BASED_OUTPATIENT_CLINIC_OR_DEPARTMENT_OTHER): Payer: Self-pay | Admitting: Emergency Medicine

## 2023-04-24 ENCOUNTER — Other Ambulatory Visit: Payer: Self-pay

## 2023-04-24 ENCOUNTER — Emergency Department (HOSPITAL_BASED_OUTPATIENT_CLINIC_OR_DEPARTMENT_OTHER)
Admission: EM | Admit: 2023-04-24 | Discharge: 2023-04-24 | Disposition: A | Discharge status: Home / Self Care | Payer: MEDICAID | Attending: Emergency Medicine | Admitting: Emergency Medicine

## 2023-04-24 DIAGNOSIS — R079 Chest pain, unspecified: Secondary | ICD-10-CM | POA: Diagnosis not present

## 2023-04-24 DIAGNOSIS — F17210 Nicotine dependence, cigarettes, uncomplicated: Secondary | ICD-10-CM | POA: Diagnosis not present

## 2023-04-24 MED ORDER — KETOROLAC TROMETHAMINE 15 MG/ML IJ SOLN
15.0000 mg | Freq: Once | INTRAMUSCULAR | Status: AC
  Administered 2023-04-24: 15 mg via INTRAMUSCULAR
  Filled 2023-04-24: qty 1

## 2023-04-24 MED ORDER — NAPROXEN 500 MG PO TABS: 500.0000 mg | ORAL_TABLET | Freq: Two times a day (BID) | ORAL | 0 refills | Status: AC

## 2023-04-24 NOTE — Discharge Instructions (Signed)
You were evaluated in the Emergency Department and after careful evaluation, we did not find any emergent condition requiring admission or further testing in the hospital.  Your exam/testing today is overall reassuring.  Please return to the Emergency Department if you experience any worsening of your condition.   Thank you for allowing us to be a part of your care. 

## 2023-04-24 NOTE — ED Provider Notes (Signed)
[facility] Emergency Department Provider Note MRN:  [id number]  Arrival date & time: 04/24/23     Chief Complaint   Chest pain History of Present Illness   Alyssa Nelson is a 37 y.o. year-old female with no pertinent past medical history presenting to the ED with chief complaint of chest pain.  Pain to the left side of the chest, worse with palpation, worse with movements.  Woke up with the pain.  Was doing a bit more labor than normal at work the day before.  No shortness of breath, no leg pain or swelling, no history of blood clots.  Review of Systems  A thorough review of systems was obtained and all systems are negative except as noted in the HPI and PMH.   Patient's Health History    Past Medical History:  Diagnosis Date   Abnormal Pap smear     Past Surgical History:  Procedure Laterality Date   WISDOM TOOTH EXTRACTION     age 16    History reviewed. No pertinent family history.  Social History   Socioeconomic History   Marital status: Single    Spouse name: Not on file   Number of children: Not on file   Years of education: Not on file   Highest education level: High school graduate  Occupational History   Not on file  Tobacco Use   Smoking status: Every Day   Smokeless tobacco: Never  Substance and Sexual Activity   Alcohol use: Not Currently   Drug use: No   Sexual activity: Yes    Birth control/protection: None  Other Topics Concern   Not on file  Social History Narrative   Not on file   Social Determinants of Health   Financial Resource Strain: Not on file  Food Insecurity: Not on file  Transportation Needs: No Transportation Needs (10/11/2019)   PRAPARE - Transportation    Lack of Transportation (Medical): No    Lack of Transportation (Non-Medical): No  Physical Activity: Not on file  Stress: Not on file  Social Connections: Unknown (01/13/2022)   Received from [facility]   Social Network    Social Network: Not  on file  Intimate Partner Violence: Unknown (12/13/2021)   Received from [facility]   HITS    Physically Hurt: Not on file    Insult or Talk Down To: Not on file    Threaten Physical Harm: Not on file    Scream or Curse: Not on file     Physical Exam   Vitals:   04/24/23 0313  BP: (!) 151/102  Pulse: 80  Resp: 14  Temp: 98 F (36.7 C)  SpO2: 100%    CONSTITUTIONAL: Well-appearing, NAD NEURO/PSYCH:  Alert and oriented x 3, no focal deficits EYES:  eyes equal and reactive ENT/NECK:  no LAD, no JVD CARDIO: Regular rate, well-perfused, normal S1 and S2 PULM:  CTAB no wheezing or rhonchi GI/GU:  non-distended, non-tender MSK/SPINE:  No gross deformities, no edema SKIN:  no rash, atraumatic   *Additional and/or pertinent findings included in MDM below  Diagnostic and Interventional Summary    EKG Interpretation Date/Time:  Friday April 24 2023 03:12:44 EDT Ventricular Rate:  69 PR Interval:  105 QRS Duration:  87 QT Interval:  379 QTC Calculation: 406 R Axis:   22  Text Interpretation: Sinus rhythm Short PR interval Confirmed by Kennis Carina [phone]) on 04/24/2023 3:51:48 AM       Labs Reviewed - No data  to display  DG Chest 2 View    (Results Pending)    Medications  ketorolac (TORADOL) 15 MG/ML injection 15 mg (has no administration in time range)     Procedures  /  Critical Care Procedures  ED Course and Medical Decision Making  Initial Impression and Ddx Reproducible pain to the left chest with palpation or with twisting movements.  Highly favoring costochondritis or MSK related pain.  Will obtain EKG and chest x-ray as a screening measure to evaluate for pneumothorax or significant abnormality.  Past medical/surgical history that increases complexity of ED encounter: None  Interpretation of Diagnostics I personally reviewed the Chest Xray and my interpretation is as follows: No pneumothorax    Patient Reassessment and Ultimate  Disposition/Management     Appropriate for discharge.  Patient management required discussion with the following services or consulting groups:  None  Complexity of Problems Addressed Acute complicated illness or Injury  Additional Data Reviewed and Analyzed Further history obtained from: None  Additional Factors Impacting ED Encounter Risk Prescriptions  Alyssa Sow. Pilar Plate, MD [facility] Emergency Medicine [facility] [email or web address]  Final Clinical Impressions(s) / ED Diagnoses     ICD-10-CM   1. Chest pain, unspecified type  R07.9       ED Discharge Orders          Ordered    naproxen (NAPROSYN) 500 MG tablet  2 times daily        04/24/23 0344             Discharge Instructions Discussed with and Provided to Patient:    Discharge Instructions      You were evaluated in the Emergency Department and after careful evaluation, we did not find any emergent condition requiring admission or further testing in the hospital.  Your exam/testing today is overall reassuring.  Please return to the Emergency Department if you experience any worsening of your condition.   Thank you for allowing [place] to be a part of your care.      Sabas Sous, MD 04/24/23 [phone]

## 2023-04-24 NOTE — ED Triage Notes (Signed)
Pt works in  [employer] and does repetitive motions. Thinks may have over reached or over used muscles. Here to rule out any issue.

## 2023-10-02 ENCOUNTER — Other Ambulatory Visit: Payer: Self-pay

## 2023-10-02 ENCOUNTER — Encounter (HOSPITAL_BASED_OUTPATIENT_CLINIC_OR_DEPARTMENT_OTHER): Payer: Self-pay | Admitting: Urology

## 2023-10-02 ENCOUNTER — Emergency Department (HOSPITAL_BASED_OUTPATIENT_CLINIC_OR_DEPARTMENT_OTHER)
Admission: EM | Admit: 2023-10-02 | Discharge: 2023-10-02 | Disposition: A | Discharge status: Home / Self Care | Payer: MEDICAID | Attending: Emergency Medicine | Admitting: Emergency Medicine

## 2023-10-02 DIAGNOSIS — Z20822 Contact with and (suspected) exposure to covid-19: Secondary | ICD-10-CM | POA: Insufficient documentation

## 2023-10-02 DIAGNOSIS — J101 Influenza due to other identified influenza virus with other respiratory manifestations: Secondary | ICD-10-CM | POA: Diagnosis not present

## 2023-10-02 DIAGNOSIS — J029 Acute pharyngitis, unspecified: Secondary | ICD-10-CM | POA: Diagnosis present

## 2023-10-02 LAB — RESP PANEL BY RT-PCR (RSV, FLU A&B, COVID)  RVPGX2
Influenza A by PCR: POSITIVE — AB
Influenza B by PCR: NEGATIVE
Resp Syncytial Virus by PCR: NEGATIVE
SARS Coronavirus 2 by RT PCR: NEGATIVE

## 2023-10-02 NOTE — ED Notes (Signed)
Reviewed discharge instructions and follow up with pt. Pt states understanding

## 2023-10-02 NOTE — ED Provider Notes (Signed)
[place] EMERGENCY DEPARTMENT AT [facility] Provider Note   CSN: [id number] Arrival date & time: 10/02/23  1713     History  Chief Complaint  Patient presents with   Covid Exposure    Alyssa Nelson is a 38 y.o. female with no documented medical history.  Patient presents to ED for evaluation of exposure to COVID-19.  Patient reports that she was exposed on Sunday and then began having sore throat, cough, runny nose, body aches and chills.  She denies any fevers at home, nausea, vomiting, diarrhea, abdominal pain, shortness of breath or chest pain.  States she has been taking no medications for her symptoms.  Denies trouble swallowing or drooling.  HPI     Home Medications Prior to Admission medications   Medication Sig Start Date End Date Taking? Authorizing Provider  cetirizine (ZYRTEC) 10 MG tablet Take 10 mg by mouth daily.    [provider]  D2000 ULTRA STRENGTH 50 MCG (2000 UT) CAPS Take 1 capsule by mouth daily. 09/17/20   [provider]  fluticasone (FLONASE) 50 MCG/ACT nasal spray Place 2 sprays into both nostrils daily. 06/19/21   [provider]  gabapentin (NEURONTIN) 100 MG capsule Take by mouth. 07/17/21 08/16/21  [provider]  losartan (COZAAR) 25 MG tablet Take 25 mg by mouth daily.  06/23/19   [provider]  metroNIDAZOLE (FLAGYL) 500 MG tablet Take 1 tablet (500 mg total) by mouth 2 (two) times daily. No alcohol while taking this medication 07/30/21   Currie Paris, NP  naproxen (NAPROSYN) 500 MG tablet Take 1 tablet (500 mg total) by mouth 2 (two) times daily. 04/24/23   Sabas Sous, MD  norgestimate-ethinyl estradiol (ORTHO-CYCLEN) 0.25-35 MG-MCG tablet Take 1 tablet by mouth daily. 07/25/21   Burleson, Brand Males, NP  omeprazole (PRILOSEC) 40 MG capsule Take 40 mg by mouth daily. 05/10/20   [provider]  oxyCODONE-acetaminophen (PERCOCET/ROXICET) 5-325 MG tablet Take 1 tablet by mouth  every 6 (six) hours as needed for severe pain. 02/05/23   Blue, Soijett A, PA-C  Polysacch Fe Cmp-Fe Heme Poly (BIFERA) 28 MG TABS Take by mouth.    [provider]  Prenatal Vit-Fe Fumarate-FA (PRENATAL ONE DAILY) 27-0.8 MG TABS Take 1 tablet by mouth daily. 09/10/20   [provider]  REMERON 15 MG tablet Take 15 mg by mouth at bedtime. 03/05/20   [provider]  traZODone (DESYREL) 50 MG tablet Take 25-50 mg by mouth at bedtime as needed. 09/10/20   [provider]      Allergies    Patient has no known allergies.    Review of Systems   Review of Systems  Constitutional:  Negative for fever.  HENT:  Positive for congestion, rhinorrhea and sore throat.   Respiratory:  Negative for shortness of breath.   Cardiovascular:  Negative for chest pain.  Gastrointestinal:  Negative for nausea and vomiting.  All other systems reviewed and are negative.   Physical Exam Updated Vital Signs BP (!) 155/94 (BP Location: Left Arm)   Pulse 82   Temp 98.2 F (36.8 C)   Resp 16   Ht 5\' 4"  (1.626 m)   Wt 62.1 kg   SpO2 100%   BMI 23.50 kg/m  Physical Exam Vitals and nursing note reviewed.  Constitutional:      General: She is not in acute distress.    Appearance: She is well-developed. She is not ill-appearing.  HENT:  Head: Normocephalic and atraumatic.     Nose: Congestion present.     Mouth/Throat:     Pharynx: Posterior oropharyngeal erythema present. No oropharyngeal exudate.     Comments: Uvula midline.  No signs of RPA, PTA.  Handling secretions appropriately. Eyes:     Conjunctiva/sclera: Conjunctivae normal.  Cardiovascular:     Rate and Rhythm: Normal rate and regular rhythm.     Heart sounds: No murmur heard. Pulmonary:     Effort: Pulmonary effort is normal. No respiratory distress.     Breath sounds: Normal breath sounds. No wheezing.     Comments: Lung sounds clear to auscultation bilaterally. Abdominal:     Palpations: Abdomen is  soft.     Tenderness: There is no abdominal tenderness.  Musculoskeletal:        General: No swelling.     Cervical back: Neck supple.  Skin:    General: Skin is warm and dry.     Capillary Refill: Capillary refill takes less than 2 seconds.  Neurological:     Mental Status: She is alert and oriented to person, place, and time.  Psychiatric:        Mood and Affect: Mood normal.     ED Results / Procedures / Treatments   Labs (all labs ordered are listed, but only abnormal results are displayed) Labs Reviewed  RESP PANEL BY RT-PCR (RSV, FLU A&B, COVID)  RVPGX2 - Abnormal; Notable for the following components:      Result Value   Influenza A by PCR POSITIVE (*)    All other components within normal limits    EKG None  Radiology No results found.  Procedures Procedures    Medications Ordered in ED Medications - No data to display  ED Course/ Medical Decision Making/ A&P  Medical Decision Making  38 year old female presents for evaluation.  Please see HPI for further details.  On examination the patient is afebrile, nontachycardic.  Her lung sounds are clear bilaterally, she is not hypoxic.  Her abdomen is soft and compressible.  Her neurological examinations at baseline.  Posterior pharynx is erythematous without exudate.  Uvula is midline, handling secretions appropriately.  No drooling or change phonation.  Overall nontoxic in appearance.  Patient viral testing are positive for influenza A.  Patient advised on how to treat symptoms conservatively at home.  Will write her a work note.  She was advised to return to the ED with any new or worsening symptoms and she voiced understanding.  Stable to discharge home.   Final Clinical Impression(s) / ED Diagnoses Final diagnoses:  Influenza A    Rx / DC Orders ED Discharge Orders     None         Al Decant, PA-C 10/02/23 1832    Anders Simmonds T, DO 10/02/23 2250

## 2023-10-02 NOTE — Discharge Instructions (Signed)
It was a pleasure taking part in your care.  As we discussed, you have the flu.  Please take Tylenol at home for headaches and fevers.  He may take ibuprofen with bodyaches and chills.  Please purchase Zicam to help short duration of symptoms.  Read attached guide concerning influenza.  Remain hydrated with Pedialyte.  Return to ED with any new or worsening symptoms.

## 2023-10-02 NOTE — ED Triage Notes (Signed)
Pt states chills, body aches,cough, runny nose since Sunday night Exposure to COVID at work

## 2023-10-22 ENCOUNTER — Other Ambulatory Visit: Payer: Self-pay

## 2023-11-10 ENCOUNTER — Other Ambulatory Visit: Payer: Self-pay

## 2023-11-26 ENCOUNTER — Other Ambulatory Visit: Payer: Self-pay

## 2024-05-25 ENCOUNTER — Other Ambulatory Visit: Payer: Self-pay

## 2024-05-25 ENCOUNTER — Emergency Department (HOSPITAL_BASED_OUTPATIENT_CLINIC_OR_DEPARTMENT_OTHER)
Admission: EM | Admit: 2024-05-25 | Discharge: 2024-05-25 | Disposition: A | Discharge status: Home / Self Care | Payer: MEDICAID | Attending: Emergency Medicine | Admitting: Emergency Medicine

## 2024-05-25 ENCOUNTER — Encounter (HOSPITAL_BASED_OUTPATIENT_CLINIC_OR_DEPARTMENT_OTHER): Payer: Self-pay | Admitting: Emergency Medicine

## 2024-05-25 DIAGNOSIS — H1032 Unspecified acute conjunctivitis, left eye: Secondary | ICD-10-CM | POA: Insufficient documentation

## 2024-05-25 DIAGNOSIS — H5712 Ocular pain, left eye: Secondary | ICD-10-CM | POA: Diagnosis present

## 2024-05-25 MED ORDER — FLUORESCEIN SODIUM 1 MG OP STRP
1.0000 | ORAL_STRIP | Freq: Once | OPHTHALMIC | Status: AC
  Administered 2024-05-25: 1 via OPHTHALMIC
  Filled 2024-05-25: qty 1

## 2024-05-25 MED ORDER — TOBRAMYCIN 0.3 % OP SOLN
2.0000 [drp] | Freq: Once | OPHTHALMIC | Status: AC
  Administered 2024-05-25: 2 [drp] via OPHTHALMIC
  Filled 2024-05-25: qty 5

## 2024-05-25 MED ORDER — TETRACAINE HCL 0.5 % OP SOLN
2.0000 [drp] | Freq: Once | OPHTHALMIC | Status: AC
  Administered 2024-05-25: 2 [drp] via OPHTHALMIC
  Filled 2024-05-25: qty 4

## 2024-05-25 NOTE — Discharge Instructions (Signed)
 Use the tobramycin  eyedrops, 2 drops 3 or 4 times daily for the next 4 to 5 days.  Return to the ER if symptoms significantly worsen or change.

## 2024-05-25 NOTE — ED Provider Notes (Signed)
  EMERGENCY DEPARTMENT AT [facility] Provider Note   CSN: [id number] Arrival date & time: 05/25/24  9772     Patient presents with: Eye Pain   Alyssa Nelson is a 38 y.o. female.   Patient is a 38 year old female presenting with left eye discomfort.  She was getting ready for work this evening when she feels as though something flew into her eye.  She has been having discomfort and swelling and watering since.  She does not wear contacts.       Prior to Admission medications   Medication Sig Start Date End Date Taking? Authorizing Provider  cetirizine (ZYRTEC) 10 MG tablet Take 10 mg by mouth daily.    [provider]  D2000 ULTRA STRENGTH 50 MCG (2000 UT) CAPS Take 1 capsule by mouth daily. 09/17/20   [provider]  fluticasone (FLONASE) 50 MCG/ACT nasal spray Place 2 sprays into both nostrils daily. 06/19/21   [provider]  gabapentin (NEURONTIN) 100 MG capsule Take by mouth. 07/17/21 08/16/21  [provider]  losartan (COZAAR) 25 MG tablet Take 25 mg by mouth daily.  06/23/19   [provider]  metroNIDAZOLE  (FLAGYL ) 500 MG tablet Take 1 tablet (500 mg total) by mouth 2 (two) times daily. No alcohol while taking this medication 07/30/21   Sid Veva CROME, NP  naproxen  (NAPROSYN ) 500 MG tablet Take 1 tablet (500 mg total) by mouth 2 (two) times daily. 04/24/23   Bero, Michael M, MD  norgestimate -ethinyl estradiol  (ORTHO-CYCLEN) 0.25-35 MG-MCG tablet Take 1 tablet by mouth daily. 07/25/21   Burleson, Veva CROME, NP  omeprazole (PRILOSEC) 40 MG capsule Take 40 mg by mouth daily. 05/10/20   [provider]  oxyCODONE -acetaminophen  (PERCOCET/ROXICET) 5-325 MG tablet Take 1 tablet by mouth every 6 (six) hours as needed for severe pain. 02/05/23   Blue, Soijett A, PA-C  Polysacch Fe Cmp-Fe Heme Poly (BIFERA) 28 MG TABS Take by mouth.    [provider]  Prenatal Vit-Fe Fumarate-FA (PRENATAL ONE DAILY) 27-0.8  MG TABS Take 1 tablet by mouth daily. 09/10/20   [provider]  REMERON 15 MG tablet Take 15 mg by mouth at bedtime. 03/05/20   [provider]  traZODone (DESYREL) 50 MG tablet Take 25-50 mg by mouth at bedtime as needed. 09/10/20   [provider]    Allergies: Patient has no known allergies.    Review of Systems  All other systems reviewed and are negative.   Updated Vital Signs BP (!) 139/98 (BP Location: Right Arm)   Pulse 76   Temp (!) 97.2 F (36.2 C) (Temporal)   Resp 18   Ht 5' 4 (1.626 m)   Wt 64 kg   SpO2 100%   BMI 24.20 kg/m   Physical Exam Vitals and nursing note reviewed.  Constitutional:      Appearance: Normal appearance.  Eyes:     Comments: Left conjunctiva is injected.  There is clear drainage.  Cornea appears clear to gross inspection.  Lids were everted with no foreign body.  Fluorescein  staining applied with no obvious abrasion to the cornea.  Pulmonary:     Effort: Pulmonary effort is normal.  Skin:    General: Skin is warm and dry.  Neurological:     Mental Status: She is alert and oriented to person, place, and time.     (all labs ordered are listed, but only abnormal results are displayed) Labs Reviewed - No data to display  EKG: None  Radiology: No results found.   Procedures   Medications Ordered in the ED  tetracaine  (PONTOCAINE) 0.5 % ophthalmic solution 2 drop (has no administration in time range)  fluorescein  ophthalmic strip 1 strip (has no administration in time range)                                    Medical Decision Making Risk Prescription drug management.   This appears to be conjunctivitis.  Will treat with tobramycin  eyedrops and follow-up as needed.     Final diagnoses:  None    ED Discharge Orders     None          Geroldine Berg, MD 05/25/24 [phone]

## 2024-05-25 NOTE — ED Triage Notes (Signed)
 Pt state left eye burning started before she went to work at 10:00 pm got worse through the night. Here for eval.
